# Patient Record
Sex: Female | Born: 1990 | Race: White | Hispanic: No | Marital: Married | Smoking: Current every day smoker
Health system: Southern US, Community
[De-identification: ages and names within clinical notes are randomized; demographics above are authoritative.]

## PROBLEM LIST (undated history)

## (undated) DIAGNOSIS — Z8711 Personal history of peptic ulcer disease: Secondary | ICD-10-CM

## (undated) DIAGNOSIS — Z8719 Personal history of other diseases of the digestive system: Secondary | ICD-10-CM

## (undated) DIAGNOSIS — F32A Depression, unspecified: Secondary | ICD-10-CM

## (undated) DIAGNOSIS — K5900 Constipation, unspecified: Secondary | ICD-10-CM

## (undated) DIAGNOSIS — F1910 Other psychoactive substance abuse, uncomplicated: Secondary | ICD-10-CM

## (undated) DIAGNOSIS — F329 Major depressive disorder, single episode, unspecified: Secondary | ICD-10-CM

## (undated) HISTORY — PX: FRACTURE SURGERY: SHX138

## (undated) HISTORY — PX: OTHER SURGICAL HISTORY: SHX169

---
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
## 2011-04-06 NOTE — H&P (Signed)
NAMEZIANA, HEYLIGER             ACCOUNT NO.:  [id number]   MEDICAL RECORD NO.:  [id number]          PATIENT TYPE:  AMB   LOCATION:  DAY                           FACILITY:  [facility]   PHYSICIAN:  Vickki Hearing, M.D.DATE OF BIRTH:  1991/03/22   DATE OF ADMISSION:  DATE OF DISCHARGE:  LH                                HISTORY & PHYSICAL   CHIEF COMPLAINT:  Hardware in her left forearm.   HISTORY:  In 1991, Drs. Pomeritus and McGee treated her with open  treatment/internal fixation for both-bone forearm fracture.  She still has  some pain in the arm and a mild loss of motion.  Based on her age, I  recommended the plates come out.  She understands that she still may have  pain in the arm.   REVIEW OF SYSTEMS:  Normal, by patient's own history.   No allergies.   No medical problems.   No other surgeries other than listed.   MEDICATIONS:  Megestrol.   FAMILY HISTORY:  Negative.   She is in the 9th grade.  Does not smoke or drink.   PHYSICAL EXAMINATION:  VITAL SIGNS:  Weight 155.  Pulse 80, respiratory rate  16.  HEENT:  Normal.  NECK:  Supple.  LUNGS:  Clear.  HEART:  Rate and rhythm normal.  ABDOMEN:  Soft.  EXTREMITIES:  Left upper extremity:  Two incisions are well healed.  There  is no pain over the incision.  She does have loss of full supination.  She  has good pronation and flexion and extension of the wrist and elbow.  Good  grip strength.  NEUROLOGIC:  No neurologic deficits are noted.   IMPRESSION:  Both-bone forearm fracture, left upper extremity.   PLAN:  Removal of hardware, left upper extremity.      SEH/MEDQ  D:  02/15/2005  T:  02/15/2005  Job:  [id number]

## 2011-04-06 NOTE — Op Note (Signed)
Dyer, Linda             ACCOUNT NO.:  [id number]   MEDICAL RECORD NO.:  [id number]          PATIENT TYPE:  AMB   LOCATION:  DAY                           FACILITY:  [facility]   PHYSICIAN:  Vickki Hearing, M.D.DATE OF BIRTH:  1991-11-14   DATE OF PROCEDURE:  02/16/2005  DATE OF DISCHARGE:                                 OPERATIVE REPORT   HISTORY:  This is a 20 year old female who had a both-bones forearm fracture  which was treated by open treatment internal fixation after failure of cast  treatment.  This was done at [facility] by the __________ and [facility] group.  She  subsequently healed the fracture and presented to my office complaining of  pain with the plates still in place, and she elected to have them removed.   PREOPERATIVE DIAGNOSIS:  Orthopedic implant, forearm fracture, left upper  extremity.   POSTOPERATIVE DIAGNOSIS:  Orthopedic implant, forearm fracture, left upper  extremity.   FINDINGS:  The bone had completely overgrown the hardware, and the bone had  to be removed with burs and osteotomes. The fracture was healed.   PROCEDURE:  Removal of hardware.   SURGEON:  Vickki Hearing, M.D.   ANESTHETIC:  General.   SPECIMEN:  None.   ESTIMATED BLOOD LOSS:  Minimal, less than 10 mL.   COMPLICATIONS:  None.   COUNTS:  Correct.   The patient went to recovery room stable.   DETAILS OF PROCEDURE:  Linda Dyer was identified in the preop holding area  as Linda Dyer.  She marked his surgical site and I countersigned the  left forearm and marked the previous skin incision.  I signed it, and then  she was given Ancef and taken to the operating room for general anesthesia.  The left upper extremity was prepared with a tourniquet and then prepped and  draped using sterile technique.  At this time the time-out was taken and all  elements were completed.  The left forearm was confirmed as the surgical  site on [facility].   We went through the radial  incision first.  Blunt dissection was carried  down to the bone.  The bone was chipped away from the plate and the plate  and screws were removed.  We then did the same procedure on the ulnar side.  We  used copious amounts of irrigation and then closed the wounds with 2-0  Monocryl and 3-0 Prolene.  We injected 30 mL of Marcaine, put on an anterior-  posterior splint, released the tourniquet and extubated the patient, took  her to the recovery room in stable condition.      SEH/MEDQ  D:  02/16/2005  T:  02/16/2005  Job:  [id number]

## 2011-08-16 LAB — URINALYSIS, ROUTINE W REFLEX MICROSCOPIC
Bilirubin Urine: NEGATIVE
Glucose, UA: NEGATIVE
Hgb urine dipstick: NEGATIVE
Ketones, ur: NEGATIVE
Nitrite: NEGATIVE
Protein, ur: NEGATIVE
Specific Gravity, Urine: 1.02
Urobilinogen, UA: 0.2
pH: 5.5

## 2011-08-16 LAB — BASIC METABOLIC PANEL
BUN: 10
CO2: 25
Calcium: 9.4
Chloride: 104
Creatinine, Ser: 0.69
GFR calc Af Amer: >60
GFR calc non Af Amer: >60
Glucose, Bld: 95
Potassium: 4
Sodium: 135

## 2011-08-16 LAB — DIFFERENTIAL
Basophils Absolute: 0
Basophils Relative: 0
Eosinophils Absolute: 0.2
Eosinophils Relative: 2
Lymphocytes Relative: 27
Lymphs Abs: 3
Monocytes Absolute: 0.8
Monocytes Relative: 7
Neutro Abs: 7.1
Neutrophils Relative %: 64

## 2011-08-16 LAB — CBC
HCT: 39.9
Hemoglobin: 14.4
MCHC: 36.1 — ABNORMAL HIGH
MCV: 87.3
Platelets: 249
RBC: 4.57
RDW: 12.3
WBC: 11.1 — ABNORMAL HIGH

## 2011-08-16 LAB — PREGNANCY, URINE: Preg Test, Ur: NEGATIVE

## 2011-08-30 LAB — STREP A DNA PROBE: Group A Strep Probe: NEGATIVE

## 2011-08-30 LAB — RAPID STREP SCREEN (MED CTR MEBANE ONLY): Streptococcus, Group A Screen (Direct): NEGATIVE

## 2011-09-04 LAB — URINALYSIS, ROUTINE W REFLEX MICROSCOPIC
Bilirubin Urine: NEGATIVE
Glucose, UA: NEGATIVE
Hgb urine dipstick: NEGATIVE
Ketones, ur: NEGATIVE
Nitrite: NEGATIVE
Protein, ur: NEGATIVE
Specific Gravity, Urine: 1.02
Urobilinogen, UA: 0.2
pH: 7

## 2011-09-04 LAB — PREGNANCY, URINE: Preg Test, Ur: NEGATIVE

## 2011-09-26 ENCOUNTER — Emergency Department (HOSPITAL_COMMUNITY)
Admission: EM | Admit: 2011-09-26 | Discharge: 2011-09-26 | Disposition: A | Discharge status: Home / Self Care | Payer: Self-pay | Attending: Emergency Medicine | Admitting: Emergency Medicine

## 2011-09-26 ENCOUNTER — Emergency Department (HOSPITAL_COMMUNITY): Payer: Self-pay

## 2011-09-26 ENCOUNTER — Encounter: Payer: Self-pay | Admitting: Emergency Medicine

## 2011-09-26 DIAGNOSIS — R079 Chest pain, unspecified: Secondary | ICD-10-CM | POA: Insufficient documentation

## 2011-09-26 DIAGNOSIS — R059 Cough, unspecified: Secondary | ICD-10-CM | POA: Insufficient documentation

## 2011-09-26 DIAGNOSIS — M25579 Pain in unspecified ankle and joints of unspecified foot: Secondary | ICD-10-CM | POA: Insufficient documentation

## 2011-09-26 DIAGNOSIS — J40 Bronchitis, not specified as acute or chronic: Secondary | ICD-10-CM | POA: Insufficient documentation

## 2011-09-26 DIAGNOSIS — R05 Cough: Secondary | ICD-10-CM | POA: Insufficient documentation

## 2011-09-26 DIAGNOSIS — M722 Plantar fascial fibromatosis: Secondary | ICD-10-CM | POA: Insufficient documentation

## 2011-09-26 MED ORDER — GUAIFENESIN-CODEINE 100-10 MG/5ML PO SYRP: 10.0000 mL | ORAL_SOLUTION | Freq: Three times a day (TID) | ORAL | Status: AC | PRN

## 2011-09-26 MED ORDER — ALBUTEROL SULFATE HFA 108 (90 BASE) MCG/ACT IN AERS
2.0000 | INHALATION_SPRAY | Freq: Once | RESPIRATORY_TRACT | Status: AC
  Administered 2011-09-26: 2 via RESPIRATORY_TRACT
  Filled 2011-09-26: qty 6.7

## 2011-09-26 MED ORDER — DOXYCYCLINE HYCLATE 100 MG PO CAPS: 100.0000 mg | ORAL_CAPSULE | Freq: Two times a day (BID) | ORAL | Status: AC

## 2011-09-26 MED ORDER — IBUPROFEN 800 MG PO TABS: 800.0000 mg | ORAL_TABLET | Freq: Three times a day (TID) | ORAL | Status: AC

## 2011-09-26 NOTE — ED Provider Notes (Signed)
History     CSN: [id number] Arrival date & time: 09/26/2011  4:45 PM   First MD Initiated Contact with Patient 09/26/11 1709      Chief Complaint  Patient presents with  . Cough    (Consider location/radiation/quality/duration/timing/severity/associated sxs/prior treatment) Patient is a 20 y.o. female presenting with cough. The history is provided by the patient.  Cough This is a new problem. The current episode started more than 1 week ago. The problem occurs every few minutes. The problem has not changed since onset.The cough is productive of sputum. There has been no fever. Associated symptoms include wheezing. Pertinent negatives include no chest pain, no chills, no ear congestion, no ear pain, no headaches, no rhinorrhea, no sore throat, no myalgias, no shortness of breath and no eye redness. Associated symptoms comments: Chest tightness. She has tried nothing for the symptoms. The treatment provided no relief. She is a smoker. Her past medical history does not include bronchitis, pneumonia, COPD or asthma.    History reviewed. No pertinent past medical history.  Past Surgical History  Procedure Date  . Left arm     History reviewed. No pertinent family history.  History  Substance Use Topics  . Smoking status: Never Smoker   . Smokeless tobacco: Not on file  . Alcohol Use: No    OB History    Grav Para Term Preterm Abortions TAB SAB Ect Mult Living                  Review of Systems  Constitutional: Negative for fever, chills and fatigue.  HENT: Positive for congestion. Negative for ear pain, sore throat, rhinorrhea, trouble swallowing, neck pain and neck stiffness.   Eyes: Negative for redness.  Respiratory: Positive for cough, chest tightness and wheezing. Negative for shortness of breath.   Cardiovascular: Negative for chest pain.  Gastrointestinal: Negative for nausea, vomiting and abdominal pain.  Genitourinary: Negative for dysuria and flank pain.    Musculoskeletal: Negative for myalgias, back pain and arthralgias.  Skin: Negative for rash.  Neurological: Negative for dizziness, weakness, numbness and headaches.  Hematological: Negative for adenopathy. Does not bruise/bleed easily.  All other systems reviewed and are negative.    Allergies  Review of patient's allergies indicates no known allergies.  Home Medications   Current Outpatient Rx  Name Route Sig Dispense Refill  . CITALOPRAM HYDROBROMIDE 20 MG PO TABS Oral Take 20 mg by mouth at bedtime.      Marland Kitchen DEXTROMETHORPHAN HBR 15 MG/5ML PO LIQD Oral Take 10 mLs by mouth as needed. For cough     . ETONOGESTREL 68 MG [place] IMPL Subcutaneous Inject 1 each into the skin once.        BP 125/67  Pulse 77  Temp(Src) 98 F (36.7 C) (Oral)  Resp 20  Ht 5\' 10"  (1.778 m)  Wt 200 lb (90.719 kg)  BMI 28.70 kg/m2  SpO2 100%  LMP 09/03/2011  Physical Exam  Nursing note and vitals reviewed. Constitutional: She is oriented to person, place, and time. She appears well-developed and well-nourished. No distress.  HENT:  Head: Normocephalic and atraumatic. No trismus in the jaw.  Right Ear: Tympanic membrane normal. No mastoid tenderness. No hemotympanum.  Left Ear: Tympanic membrane normal. No mastoid tenderness. No hemotympanum.  Mouth/Throat: Uvula is midline and mucous membranes are normal. No uvula swelling. Posterior oropharyngeal erythema present. No oropharyngeal exudate, posterior oropharyngeal edema or tonsillar abscesses.  Neck: Normal range of motion. Neck supple. No JVD present.  Cardiovascular: Normal rate, regular rhythm and normal heart sounds.   Pulmonary/Chest: Effort normal. No respiratory distress. She has wheezes. She has no rales. She exhibits no tenderness.  Abdominal: Soft. She exhibits no distension and no mass. There is no tenderness. There is no rebound and no guarding.  Musculoskeletal: Normal range of motion. She exhibits no edema and no tenderness.   Lymphadenopathy:    She has no cervical adenopathy.  Neurological: She is alert and oriented to person, place, and time. No cranial nerve deficit. She exhibits normal muscle tone. Coordination normal.  Skin: Skin is warm and dry.    ED Course  Procedures (including critical care time)  Dg Chest 2 View  09/26/2011  *RADIOLOGY REPORT*  Clinical Data: Cough, pain  CHEST - 2 VIEW  Comparison: 08/28/2004  Findings: Cardiomediastinal silhouette is stable.  Pectus excavatum deformity again noted. No acute infiltrate or pleural effusion.  No pulmonary edema.  IMPRESSION: No active disease.  No significant change.  Original Report Authenticated By: Natasha Mead, M.D.   Dg Foot Complete Right  09/26/2011  *RADIOLOGY REPORT*  Clinical Data: Right foot pain  RIGHT FOOT COMPLETE - 3+ VIEW  Comparison: None.  Findings: Three views of the right foot submitted.  No acute fracture or subluxation.  No periosteal reaction or bony erosion.  IMPRESSION: No acute fracture or subluxation.  Original Report Authenticated By: Natasha Mead, M.D.        MDM    7:10 PM patient is alert, NAD.  Non-toxic appearing. Cough, nasal congestion and occasional expiratory wheezing.   No fever, hypoxia, tachycardia or tachypnea to suggest PE.   Will treat with albuterol inhaler, abx and cough syrup.  Agrees to close follow-up with her PMD.  Also agrees to return here if sx's worsen.        Shaylynne Lunt L. [place], [place] 09/27/11 2026

## 2011-09-26 NOTE — ED Notes (Addendum)
Pt c/o cough/congestion x 10 days. Pt c/o chest pain from coughing x 2 days.  Pt also c/o bilateral foot pain.

## 2011-09-28 NOTE — ED Provider Notes (Signed)
Medical screening examination/treatment/procedure(s) were performed by non-physician practitioner and as supervising physician I was immediately available for consultation/collaboration.   Joya Gaskins, MD 09/28/11 [phone]

## 2012-09-01 ENCOUNTER — Emergency Department (HOSPITAL_COMMUNITY)
Admission: EM | Admit: 2012-09-01 | Discharge: 2012-09-01 | Disposition: A | Discharge status: Home / Self Care | Payer: Self-pay | Attending: Emergency Medicine | Admitting: Emergency Medicine

## 2012-09-01 ENCOUNTER — Emergency Department (HOSPITAL_COMMUNITY): Payer: Self-pay

## 2012-09-01 ENCOUNTER — Encounter (HOSPITAL_COMMUNITY): Payer: Self-pay

## 2012-09-01 DIAGNOSIS — R109 Unspecified abdominal pain: Secondary | ICD-10-CM

## 2012-09-01 DIAGNOSIS — K5900 Constipation, unspecified: Secondary | ICD-10-CM | POA: Insufficient documentation

## 2012-09-01 HISTORY — DX: Constipation, unspecified: K59.00

## 2012-09-01 LAB — CBC WITH DIFFERENTIAL/PLATELET
Basophils Absolute: 0 10*3/uL (ref 0.0–0.1)
Basophils Relative: 1 % (ref 0–1)
Eosinophils Absolute: 0.2 10*3/uL (ref 0.0–0.7)
Eosinophils Relative: 3 % (ref 0–5)
HCT: 42.2 % (ref 36.0–46.0)
Hemoglobin: 14.4 g/dL (ref 12.0–15.0)
Lymphocytes Relative: 33 % (ref 12–46)
Lymphs Abs: 2.1 10*3/uL (ref 0.7–4.0)
MCH: 30.1 pg (ref 26.0–34.0)
MCHC: 34.1 g/dL (ref 30.0–36.0)
MCV: 88.1 fL (ref 78.0–100.0)
Monocytes Absolute: 0.6 10*3/uL (ref 0.1–1.0)
Monocytes Relative: 10 % (ref 3–12)
Neutro Abs: 3.5 10*3/uL (ref 1.7–7.7)
Neutrophils Relative %: 53 % (ref 43–77)
Platelets: 182 10*3/uL (ref 150–400)
RBC: 4.79 MIL/uL (ref 3.87–5.11)
RDW: 12.3 % (ref 11.5–15.5)
WBC: 6.4 10*3/uL (ref 4.0–10.5)

## 2012-09-01 LAB — COMPREHENSIVE METABOLIC PANEL
ALT: 9 U/L (ref 0–35)
AST: 13 U/L (ref 0–37)
Albumin: 3.9 g/dL (ref 3.5–5.2)
Alkaline Phosphatase: 58 U/L (ref 39–117)
BUN: 7 mg/dL (ref 6–23)
CO2: 28 mEq/L (ref 19–32)
Calcium: 9.3 mg/dL (ref 8.4–10.5)
Chloride: 102 mEq/L (ref 96–112)
Creatinine, Ser: 0.63 mg/dL (ref 0.50–1.10)
GFR calc Af Amer: >90 mL/min (ref >90)
GFR calc non Af Amer: >90 mL/min (ref >90)
Glucose, Bld: 87 mg/dL (ref 70–99)
Potassium: 3.7 mEq/L (ref 3.5–5.1)
Sodium: 138 mEq/L (ref 135–145)
Total Bilirubin: 0.2 mg/dL — ABNORMAL LOW (ref 0.3–1.2)
Total Protein: 7.1 g/dL (ref 6.0–8.3)

## 2012-09-01 LAB — PREGNANCY, URINE: Preg Test, Ur: NEGATIVE

## 2012-09-01 LAB — URINALYSIS, ROUTINE W REFLEX MICROSCOPIC
Bilirubin Urine: NEGATIVE
Glucose, UA: NEGATIVE mg/dL
Hgb urine dipstick: NEGATIVE
Ketones, ur: NEGATIVE mg/dL
Leukocytes, UA: NEGATIVE
Nitrite: NEGATIVE
Protein, ur: NEGATIVE mg/dL
Specific Gravity, Urine: 1.015 (ref 1.005–1.030)
Urobilinogen, UA: 1 mg/dL (ref 0.0–1.0)
pH: 8.5 — ABNORMAL HIGH (ref 5.0–8.0)

## 2012-09-01 LAB — LIPASE, BLOOD: Lipase: 20 U/L (ref 11–59)

## 2012-09-01 MED ORDER — IOHEXOL 300 MG/ML  SOLN
100.0000 mL | Freq: Once | INTRAMUSCULAR | Status: AC | PRN
  Administered 2012-09-01: 100 mL via INTRAVENOUS

## 2012-09-01 MED ORDER — SODIUM CHLORIDE 0.9 % IV SOLN
INTRAVENOUS | Status: DC
  Administered 2012-09-01: 21:00:00 via INTRAVENOUS

## 2012-09-01 NOTE — ED Notes (Signed)
Haven't been able to go to the bathroom really well in about a month. Have taken 5 enemas, ex-lax, mag citrate (today). Having abdominal pain and lower back pain per pt.

## 2012-09-01 NOTE — Discharge Instructions (Signed)
Follow up as needed

## 2012-09-01 NOTE — ED Provider Notes (Signed)
History     CSN: [id number]  Arrival date & time 09/01/12  6578   First MD Initiated Contact with Patient 09/01/12 2028      Chief Complaint  Patient presents with  . Abdominal Pain  . Constipation     HPI Pt was seen at 2030.  Per pt, c/o gradual onset and persistence of constant generalized abd "pain" for the past 1 month.  Has been associated with constipation and nausea.  States she has taken multiple enemas, magnesium citrate and ex-lax without relief.  Describes the abd pain as "cramping."  Denies vomiting, no flank pain, no fevers, no vaginal bleeding/discharge.    Past Medical History  Diagnosis Date  . Constipation     around age 21    Past Surgical History  Procedure Date  . Left arm     History  Substance Use Topics  . Smoking status: Never Smoker   . Smokeless tobacco: Not on file  . Alcohol Use: No      Review of Systems ROS: Statement: All systems negative except as marked or noted in the HPI; Constitutional: Negative for fever and chills. ; ; Eyes: Negative for eye pain, redness and discharge. ; ; ENMT: Negative for ear pain, hoarseness, nasal congestion, sinus pressure and sore throat. ; ; Cardiovascular: Negative for chest pain, palpitations, diaphoresis, dyspnea and peripheral edema. ; ; Respiratory: Negative for cough, wheezing and stridor. ; ; Gastrointestinal: +abd pain, constipation, nausea. Negative for vomiting, diarrhea, blood in stool, hematemesis, jaundice and rectal bleeding. . ; ; Genitourinary: Negative for dysuria, flank pain and hematuria. ; ; Musculoskeletal: Negative for back pain and neck pain. Negative for swelling and trauma.; ; Skin: Negative for pruritus, rash, abrasions, blisters, bruising and skin lesion.; ; Neuro: Negative for headache, lightheadedness and neck stiffness. Negative for weakness, altered level of consciousness , altered mental status, extremity weakness, paresthesias, involuntary movement, seizure and syncope.        Allergies  Review of patient's allergies indicates no known allergies.  Home Medications   Current Outpatient Rx  Name Route Sig Dispense Refill  . CITALOPRAM HYDROBROMIDE 20 MG PO TABS Oral Take 20 mg by mouth at bedtime.      Marland Kitchen DEXTROMETHORPHAN HBR 15 MG/5ML PO LIQD Oral Take 10 mLs by mouth as needed. For cough     . ETONOGESTREL 68 MG [place] IMPL Subcutaneous Inject 1 each into the skin once.        BP 126/72  Pulse 95  Temp 98.5 F (36.9 C) (Oral)  Resp 20  Ht 5\' 11"  (1.803 m)  Wt 205 lb (92.987 kg)  BMI 28.59 kg/m2  SpO2 96%  LMP 08/18/2012  Physical Exam 2035: Physical examination:  Nursing notes reviewed; Vital signs and O2 SAT reviewed;  Constitutional: Well developed, Well nourished, Well hydrated, In no acute distress; Head:  Normocephalic, atraumatic; Eyes: EOMI, PERRL, No scleral icterus; ENMT: Mouth and pharynx normal, Mucous membranes moist; Neck: Supple, Full range of motion, No lymphadenopathy; Cardiovascular: Regular rate and rhythm, No murmur, rub, or gallop; Respiratory: Breath sounds clear & equal bilaterally, No rales, rhonchi, wheezes.  Speaking full sentences with ease, Normal respiratory effort/excursion; Chest: Nontender, Movement normal; Abdomen: Soft, +mild diffuse tenderness to palp. No rebound or guarding. Nondistended, Normal bowel sounds; Genitourinary: No CVA tenderness; Extremities: Pulses normal, No tenderness, No edema, No calf edema or asymmetry.; Neuro: AA&Ox3, Major CN grossly intact.  Speech clear. No gross focal motor or sensory deficits in extremities.; Skin: Color normal,  Warm, Dry.   ED Course  Procedures   MDM  MDM Reviewed: nursing note and vitals Interpretation: labs     Results for orders placed during the hospital encounter of 09/01/12  CBC WITH DIFFERENTIAL      Component Value Range   WBC 6.4  4.0 - 10.5 K/uL   RBC 4.79  3.87 - 5.11 MIL/uL   Hemoglobin 14.4  12.0 - 15.0 g/dL   HCT 36.6  44.0 - 34.7 %   MCV 88.1   78.0 - 100.0 fL   MCH 30.1  26.0 - 34.0 pg   MCHC 34.1  30.0 - 36.0 g/dL   RDW 42.5  95.6 - 38.7 %   Platelets 182  150 - 400 K/uL   Neutrophils Relative 53  43 - 77 %   Neutro Abs 3.5  1.7 - 7.7 K/uL   Lymphocytes Relative 33  12 - 46 %   Lymphs Abs 2.1  0.7 - 4.0 K/uL   Monocytes Relative 10  3 - 12 %   Monocytes Absolute 0.6  0.1 - 1.0 K/uL   Eosinophils Relative 3  0 - 5 %   Eosinophils Absolute 0.2  0.0 - 0.7 K/uL   Basophils Relative 1  0 - 1 %   Basophils Absolute 0.0  0.0 - 0.1 K/uL  COMPREHENSIVE METABOLIC PANEL      Component Value Range   Sodium 138  135 - 145 mEq/L   Potassium 3.7  3.5 - 5.1 mEq/L   Chloride 102  96 - 112 mEq/L   CO2 28  19 - 32 mEq/L   Glucose, Bld 87  70 - 99 mg/dL   BUN 7  6 - 23 mg/dL   Creatinine, Ser 5.64  0.50 - 1.10 mg/dL   Calcium 9.3  8.4 - 33.2 mg/dL   Total Protein 7.1  6.0 - 8.3 g/dL   Albumin 3.9  3.5 - 5.2 g/dL   AST 13  0 - 37 U/L   ALT 9  0 - 35 U/L   Alkaline Phosphatase 58  39 - 117 U/L   Total Bilirubin 0.2 (*) 0.3 - 1.2 mg/dL   GFR calc non Af Amer >90  >90 mL/min   GFR calc Af Amer >90  >90 mL/min  LIPASE, BLOOD      Component Value Range   Lipase 20  11 - 59 U/L  PREGNANCY, URINE      Component Value Range   Preg Test, Ur NEGATIVE  NEGATIVE  URINALYSIS, ROUTINE W REFLEX MICROSCOPIC      Component Value Range   Color, Urine YELLOW  YELLOW   APPearance HAZY (*) CLEAR   Specific Gravity, Urine 1.015  1.005 - 1.030   pH 8.5 (*) 5.0 - 8.0   Glucose, UA NEGATIVE  NEGATIVE mg/dL   Hgb urine dipstick NEGATIVE  NEGATIVE   Bilirubin Urine NEGATIVE  NEGATIVE   Ketones, ur NEGATIVE  NEGATIVE mg/dL   Protein, ur NEGATIVE  NEGATIVE mg/dL   Urobilinogen, UA 1.0  0.0 - 1.0 mg/dL   Nitrite NEGATIVE  NEGATIVE   Leukocytes, UA NEGATIVE  NEGATIVE     2240:  Has tol PO well in ED without N/V.  CT scan pending.  Sign out to Dr. Estell Harpin.          Laray Anger, DO 09/01/12 2241

## 2012-11-02 ENCOUNTER — Encounter (HOSPITAL_COMMUNITY): Payer: Self-pay

## 2012-11-02 ENCOUNTER — Emergency Department (HOSPITAL_COMMUNITY)
Admission: EM | Admit: 2012-11-02 | Discharge: 2012-11-02 | Disposition: A | Discharge status: Home / Self Care | Payer: Self-pay | Attending: Emergency Medicine | Admitting: Emergency Medicine

## 2012-11-02 ENCOUNTER — Emergency Department (HOSPITAL_COMMUNITY): Payer: Self-pay

## 2012-11-02 DIAGNOSIS — R109 Unspecified abdominal pain: Secondary | ICD-10-CM | POA: Insufficient documentation

## 2012-11-02 DIAGNOSIS — K5900 Constipation, unspecified: Secondary | ICD-10-CM | POA: Insufficient documentation

## 2012-11-02 DIAGNOSIS — R509 Fever, unspecified: Secondary | ICD-10-CM | POA: Insufficient documentation

## 2012-11-02 MED ORDER — PEG 3350-KCL-NA BICARB-NACL 420 G PO SOLR
4000.0000 mL | Freq: Once | ORAL | Status: AC
  Administered 2012-11-02: 4000 mL via ORAL
  Filled 2012-11-02: qty 4000

## 2012-11-02 MED ORDER — FLEET ENEMA 7-19 GM/118ML RE ENEM
1.0000 | ENEMA | Freq: Once | RECTAL | Status: AC
  Administered 2012-11-02: 1 via RECTAL

## 2012-11-02 NOTE — ED Notes (Signed)
I have been having problems with constipation. I have been using an enema without relief per pt. I have been running a fever and shaking per pt.

## 2012-11-02 NOTE — ED Provider Notes (Signed)
History     CSN: [id number]  Arrival date & time 11/02/12  0112   First MD Initiated Contact with Patient 11/02/12 0139      Chief Complaint  Patient presents with  . Constipation    (Consider location/radiation/quality/duration/timing/severity/associated sxs/prior treatment) HPI   Linda Dyer is a 21 y.o. female who presents to the Emergency Department complaining of constipation. She states she has been using an enema occasionally since being seen for similar symptoms 09/01/12. She has not had a proper bowel movement since being in the ER previously. She states that she has had fever and abdominal cramping on occasion since 09/01/12. CT 09/01/12  showing only stool burden. She has not seen GI due to financial problems. Has been using a stool softener occasionally.   PCP Dr. Dimas Aguas Past Medical History  Diagnosis Date  . Constipation     around age 51    Past Surgical History  Procedure Date  . Left arm     No family history on file.  History  Substance Use Topics  . Smoking status: Never Smoker   . Smokeless tobacco: Not on file  . Alcohol Use: No    OB History    Grav Para Term Preterm Abortions TAB SAB Ect Mult Living                  Review of Systems  Constitutional: Negative for fever.       10 Systems reviewed and are negative for acute change except as noted in the HPI.  HENT: Negative for congestion.   Eyes: Negative for discharge and redness.  Respiratory: Negative for cough and shortness of breath.   Cardiovascular: Negative for chest pain.  Gastrointestinal: Negative for vomiting and abdominal pain.       Constipation  Musculoskeletal: Negative for back pain.  Skin: Negative for rash.  Neurological: Negative for syncope, numbness and headaches.  Psychiatric/Behavioral:       No behavior change.    Allergies  Review of patient's allergies indicates no known allergies.  Home Medications   Current Outpatient Rx  Name  Route  Sig   Dispense  Refill  . BISACODYL 5 MG PO TBEC   Oral   Take 5 mg by mouth once as needed.         Marland Kitchen CITALOPRAM HYDROBROMIDE 20 MG PO TABS   Oral   Take 20 mg by mouth at bedtime.           . ETONOGESTREL 68 MG [place] IMPL   Subcutaneous   Inject 1 each into the skin once.           Marland Kitchen ENEMA RE   Rectal   Place 1 Bottle rectally once as needed.           BP 120/70  Pulse 86  Temp 98.1 F (36.7 C) (Oral)  Resp 18  Ht 5\' 11"  (1.803 m)  Wt 210 lb (95.255 kg)  BMI 29.29 kg/m2  SpO2 96%  LMP 10/19/2012  Physical Exam  Nursing note and vitals reviewed. Constitutional: She appears well-developed and well-nourished.       Awake, alert, nontoxic appearance.  HENT:  Head: Atraumatic.  Eyes: Right eye exhibits no discharge. Left eye exhibits no discharge.  Neck: Neck supple.  Cardiovascular: Normal heart sounds.   Pulmonary/Chest: Effort normal and breath sounds normal. She exhibits no tenderness.  Abdominal: Soft. There is no tenderness. There is no rebound.  Genitourinary:  No stool int he rectal vault  Musculoskeletal: Normal range of motion. She exhibits no tenderness.       Baseline ROM, no obvious new focal weakness.  Neurological:       Mental status and motor strength appears baseline for patient and situation.  Skin: No rash noted.  Psychiatric: She has a normal mood and affect.    ED Course  Procedures (including critical care time)  Labs Reviewed - No data to display Dg Abd 1 View  11/02/2012  *RADIOLOGY REPORT*  Clinical Data: Constipation for months.  ABDOMEN - 1 VIEW  Comparison: Abdomen and pelvis 09/01/2012  Findings: Diffusely stool filled colon.  No small bowel distension. No radiopaque stones.  Visualized bones appear intact.  IMPRESSION: Diffusely stool filled colon.  Nonobstructive bowel gas pattern.   Original Report Authenticated By: Burman Nieves, M.D.      1. Constipation       MDM  Patient with h/o constipation here with same.  KUB with large stool burden. Given fleet enema with no results. Sent home with Nulytely and instructions to administer 3 cups every 8 hours. Referral made to GI. Pt stable in ED with no significant deterioration in condition.The patient appears reasonably screened and/or stabilized for discharge and I doubt any other medical condition or other [facility] requiring further screening, evaluation, or treatment in the ED at this time prior to discharge.  MDM Reviewed: nursing note, previous chart and vitals Reviewed previous: CT scan Interpretation: x-ray           Nicoletta Dress. Colon Branch, MD 11/02/12 [phone]

## 2013-04-02 ENCOUNTER — Emergency Department (HOSPITAL_COMMUNITY)
Admission: EM | Admit: 2013-04-02 | Discharge: 2013-04-03 | Disposition: A | Discharge status: Home / Self Care | Payer: Self-pay | Attending: Emergency Medicine | Admitting: Emergency Medicine

## 2013-04-02 DIAGNOSIS — R1110 Vomiting, unspecified: Secondary | ICD-10-CM | POA: Insufficient documentation

## 2013-04-02 DIAGNOSIS — Z79899 Other long term (current) drug therapy: Secondary | ICD-10-CM | POA: Insufficient documentation

## 2013-04-02 DIAGNOSIS — Z3202 Encounter for pregnancy test, result negative: Secondary | ICD-10-CM | POA: Insufficient documentation

## 2013-04-02 DIAGNOSIS — K5900 Constipation, unspecified: Secondary | ICD-10-CM | POA: Insufficient documentation

## 2013-04-02 DIAGNOSIS — R1084 Generalized abdominal pain: Secondary | ICD-10-CM | POA: Insufficient documentation

## 2013-04-03 ENCOUNTER — Encounter (HOSPITAL_COMMUNITY): Payer: Self-pay | Admitting: *Deleted

## 2013-04-03 ENCOUNTER — Emergency Department (HOSPITAL_COMMUNITY): Payer: Self-pay

## 2013-04-03 LAB — URINALYSIS, ROUTINE W REFLEX MICROSCOPIC
Bilirubin Urine: NEGATIVE
Glucose, UA: NEGATIVE mg/dL
Ketones, ur: NEGATIVE mg/dL
Leukocytes, UA: NEGATIVE
Protein, ur: NEGATIVE mg/dL

## 2013-04-03 LAB — COMPREHENSIVE METABOLIC PANEL
Alkaline Phosphatase: 61 U/L (ref 39–117)
BUN: 13 mg/dL (ref 6–23)
CO2: 26 mEq/L (ref 19–32)
Chloride: 97 mEq/L (ref 96–112)
Creatinine, Ser: 0.79 mg/dL (ref 0.50–1.10)
GFR calc non Af Amer: >90 mL/min (ref >90)
Potassium: 3.7 mEq/L (ref 3.5–5.1)
Total Bilirubin: 0.2 mg/dL — ABNORMAL LOW (ref 0.3–1.2)

## 2013-04-03 LAB — LIPASE, BLOOD: Lipase: 22 U/L (ref 11–59)

## 2013-04-03 LAB — CBC
HCT: 42.7 % (ref 36.0–46.0)
Hemoglobin: 14.9 g/dL (ref 12.0–15.0)
MCV: 90.5 fL (ref 78.0–100.0)
RBC: 4.72 MIL/uL (ref 3.87–5.11)
WBC: 8.6 10*3/uL (ref 4.0–10.5)

## 2013-04-03 MED ORDER — ONDANSETRON HCL 4 MG PO TABS: 4.0000 mg | ORAL_TABLET | Freq: Four times a day (QID) | ORAL | Status: DC

## 2013-04-03 MED ORDER — IOHEXOL 300 MG/ML  SOLN
50.0000 mL | Freq: Once | INTRAMUSCULAR | Status: AC | PRN
  Administered 2013-04-03: 50 mL via ORAL

## 2013-04-03 MED ORDER — DOCUSATE SODIUM 100 MG PO CAPS: 100.0000 mg | ORAL_CAPSULE | Freq: Two times a day (BID) | ORAL | Status: DC

## 2013-04-03 MED ORDER — IOHEXOL 300 MG/ML  SOLN
100.0000 mL | Freq: Once | INTRAMUSCULAR | Status: AC | PRN
  Administered 2013-04-03: 100 mL via INTRAVENOUS

## 2013-04-03 MED ORDER — ONDANSETRON HCL 4 MG/2ML IJ SOLN
4.0000 mg | Freq: Once | INTRAMUSCULAR | Status: AC
  Administered 2013-04-03: 4 mg via INTRAVENOUS
  Filled 2013-04-03: qty 2

## 2013-04-03 MED ORDER — SODIUM CHLORIDE 0.9 % IV SOLN
INTRAVENOUS | Status: DC
  Administered 2013-04-03: 01:00:00 via INTRAVENOUS

## 2013-04-03 NOTE — ED Provider Notes (Signed)
History     CSN: [id number]  Arrival date & time 04/02/13  2336   First MD Initiated Contact with Patient 04/03/13 0126      Chief Complaint  Patient presents with  . Constipation  . Emesis  . Abdominal Pain    (Consider location/radiation/quality/duration/timing/severity/associated sxs/prior treatment) HPI HX provided by patient - has chronic constipation worse over the last 2 weeks, no BM in 10 days despite daily stool softner, mag citrate yesterday and fleets enema.  She developed emesis yesterday with upper ABD discomfort. She has no h/o ABD surgeries, no h/o bowel obstruction but is worried about the same. No F/C, no recent illness otherwise, no new medications. Symptoms mod in severity. Has never seen a GI specialist   Past Medical History  Diagnosis Date  . Constipation     around age 22    Past Surgical History  Procedure Laterality Date  . Left arm      History reviewed. No pertinent family history.  History  Substance Use Topics  . Smoking status: Never Smoker   . Smokeless tobacco: Not on file  . Alcohol Use: No    OB History   Grav Para Term Preterm Abortions TAB SAB Ect Mult Living                  Review of Systems  Constitutional: Negative for fever and chills.  HENT: Negative for neck pain and neck stiffness.   Eyes: Negative for pain.  Respiratory: Negative for shortness of breath.   Cardiovascular: Negative for chest pain.  Gastrointestinal: Positive for vomiting, abdominal pain and constipation.  Genitourinary: Negative for dysuria.  Musculoskeletal: Negative for back pain.  Skin: Negative for rash.  Neurological: Negative for headaches.  All other systems reviewed and are negative.    Allergies  Review of patient's allergies indicates no known allergies.  Home Medications   Current Outpatient Rx  Name  Route  Sig  Dispense  Refill  . bisacodyl (DULCOLAX) 5 MG EC tablet   Oral   Take 5 mg by mouth once as needed.         .  citalopram (CELEXA) 20 MG tablet   Oral   Take 20 mg by mouth at bedtime.           Marland Kitchen levonorgestrel-ethinyl estradiol (AVIANE,ALESSE,LESSINA) 0.1-20 MG-MCG tablet   Oral   Take 1 tablet by mouth daily.         . Sodium Phosphates (ENEMA RE)   Rectal   Place 1 Bottle rectally once as needed.         . etonogestrel (IMPLANON) 68 MG IMPL implant   Subcutaneous   Inject 1 each into the skin once.             BP 142/86  Pulse 84  Temp(Src) 97.9 F (36.6 C) (Oral)  Ht 5\' 11"  (1.803 m)  Wt 210 lb (95.255 kg)  BMI 29.3 kg/m2  SpO2 100%  LMP 04/03/2013  Physical Exam  Constitutional: She is oriented to person, place, and time. She appears well-developed and well-nourished.  HENT:  Head: Normocephalic and atraumatic.  Mouth/Throat: Oropharynx is clear and moist. No oropharyngeal exudate.  Eyes: EOM are normal. Pupils are equal, round, and reactive to light. No scleral icterus.  Neck: Neck supple.  Cardiovascular: Normal rate, regular rhythm, normal heart sounds and intact distal pulses.   Pulmonary/Chest: Effort normal and breath sounds normal. No respiratory distress.  Abdominal: Soft. Bowel sounds are normal. She exhibits  no distension. There is no rebound and no guarding.  Mild diffuse tenderness  Musculoskeletal: Normal range of motion. She exhibits no edema.  Neurological: She is alert and oriented to person, place, and time.  Skin: Skin is warm and dry.    ED Course  Procedures (including critical care time)  Results for orders placed during the hospital encounter of 04/02/13  CBC      Result Value Range   WBC 8.6  4.0 - 10.5 K/uL   RBC 4.72  3.87 - 5.11 MIL/uL   Hemoglobin 14.9  12.0 - 15.0 g/dL   HCT 40.9  81.1 - 91.4 %   MCV 90.5  78.0 - 100.0 fL   MCH 31.6  26.0 - 34.0 pg   MCHC 34.9  30.0 - 36.0 g/dL   RDW 78.2  95.6 - 21.3 %   Platelets 174  150 - 400 K/uL  COMPREHENSIVE METABOLIC PANEL      Result Value Range   Sodium 136  135 - 145 mEq/L    Potassium 3.7  3.5 - 5.1 mEq/L   Chloride 97  96 - 112 mEq/L   CO2 26  19 - 32 mEq/L   Glucose, Bld 88  70 - 99 mg/dL   BUN 13  6 - 23 mg/dL   Creatinine, Ser 0.86  0.50 - 1.10 mg/dL   Calcium 9.5  8.4 - 57.8 mg/dL   Total Protein 8.1  6.0 - 8.3 g/dL   Albumin 4.3  3.5 - 5.2 g/dL   AST 16  0 - 37 U/L   ALT 17  0 - 35 U/L   Alkaline Phosphatase 61  39 - 117 U/L   Total Bilirubin 0.2 (*) 0.3 - 1.2 mg/dL   GFR calc non Af Amer >90  >90 mL/min   GFR calc Af Amer >90  >90 mL/min  LIPASE, BLOOD      Result Value Range   Lipase 22  11 - 59 U/L  URINALYSIS, ROUTINE W REFLEX MICROSCOPIC      Result Value Range   Color, Urine YELLOW  YELLOW   APPearance CLEAR  CLEAR   Specific Gravity, Urine <1.005 (*) 1.005 - 1.030   pH 5.5  5.0 - 8.0   Glucose, UA NEGATIVE  NEGATIVE mg/dL   Hgb urine dipstick NEGATIVE  NEGATIVE   Bilirubin Urine NEGATIVE  NEGATIVE   Ketones, ur NEGATIVE  NEGATIVE mg/dL   Protein, ur NEGATIVE  NEGATIVE mg/dL   Urobilinogen, UA 0.2  0.0 - 1.0 mg/dL   Nitrite NEGATIVE  NEGATIVE   Leukocytes, UA NEGATIVE  NEGATIVE  POCT PREGNANCY, URINE      Result Value Range   Preg Test, Ur NEGATIVE  NEGATIVE   Ct Abdomen Pelvis W Contrast  04/03/2013   *RADIOLOGY REPORT*  Clinical Data: Abdominal pain  CT ABDOMEN AND PELVIS WITH CONTRAST  Technique:  Multidetector CT imaging of the abdomen and pelvis was performed following the standard protocol during bolus administration of intravenous contrast.  Contrast: 50mL OMNIPAQUE IOHEXOL 300 MG/ML  SOLN, OMNIPAQUE IOHEXOL 300 MG/ML  SOLN  Comparison: 09/01/2012  Findings: Lung bases are predominately clear.  Heart size within normal limits.  No pleural or pericardial effusion.  Unremarkable liver, biliary system, spleen, pancreas, adrenal glands.  Symmetric renal enhancement.  Extrarenal pelvis on the right.  No hydronephrosis or hydroureter.  No CT evidence for colitis.  Normal appendix.  Small bowel loops are normal course and caliber.   No free intraperitoneal  air.  Trace free fluid within the pelvis.  Partially distended bladder.  Tampon within the vagina. Heterogeneous attenuation of the uterus with a rounded area of enhancement posteriorly favored to reflect a fibroid.  No adnexal mass.  Prominent left gonadal vein and pelvic vessels.  Normal caliber aorta and branch vessels.  No acute osseous finding.  IMPRESSION: No acute abdominopelvic process identified by CT.  Posterior uterine fibroid.   Original Report Authenticated By: Jearld Lesch, M.D.   IVFs, zofran  4:11 AM repeat exam no acute ABD - constipation precautions provided and verbalized as understood, diet instructions and referral provided. RX colace and zofran. Plan continue miralax.   MDM   Constipation with emesis, evaluated with CT scan no obstruction.  UA and labs reviewed. VS and nursing notes reviewed.         Sunnie Nielsen, MD 04/03/13 [phone]

## 2013-04-03 NOTE — ED Notes (Signed)
Pt alert & oriented x4, stable gait. Patient given discharge instructions, paperwork & prescription(s). Patient  instructed to stop at the registration desk to finish any additional paperwork. Patient verbalized understanding. Pt left department w/ no further questions. 

## 2013-04-03 NOTE — ED Notes (Signed)
Pt has not had a good BM in 10 days, has used enemas, xlax, and magnesium citrate with very little results. Also co n/v

## 2013-12-17 ENCOUNTER — Encounter (HOSPITAL_COMMUNITY): Payer: Self-pay | Admitting: Emergency Medicine

## 2013-12-17 ENCOUNTER — Emergency Department (HOSPITAL_COMMUNITY)
Admission: EM | Admit: 2013-12-17 | Discharge: 2013-12-18 | Disposition: A | Discharge status: Home / Self Care | Payer: Medicaid Other | Attending: Emergency Medicine | Admitting: Emergency Medicine

## 2013-12-17 DIAGNOSIS — K5900 Constipation, unspecified: Secondary | ICD-10-CM | POA: Insufficient documentation

## 2013-12-17 DIAGNOSIS — Z8711 Personal history of peptic ulcer disease: Secondary | ICD-10-CM | POA: Insufficient documentation

## 2013-12-17 DIAGNOSIS — R1013 Epigastric pain: Secondary | ICD-10-CM | POA: Insufficient documentation

## 2013-12-17 DIAGNOSIS — R109 Unspecified abdominal pain: Secondary | ICD-10-CM

## 2013-12-17 DIAGNOSIS — Z79899 Other long term (current) drug therapy: Secondary | ICD-10-CM | POA: Insufficient documentation

## 2013-12-17 DIAGNOSIS — R112 Nausea with vomiting, unspecified: Secondary | ICD-10-CM | POA: Insufficient documentation

## 2013-12-17 HISTORY — DX: Personal history of other diseases of the digestive system: Z87.19

## 2013-12-17 HISTORY — DX: Personal history of peptic ulcer disease: Z87.11

## 2013-12-17 LAB — COMPREHENSIVE METABOLIC PANEL
ALT: 48 U/L — AB (ref 0–35)
AST: 31 U/L (ref 0–37)
Albumin: 4 g/dL (ref 3.5–5.2)
Alkaline Phosphatase: 57 U/L (ref 39–117)
BILIRUBIN TOTAL: 0.2 mg/dL — AB (ref 0.3–1.2)
BUN: 14 mg/dL (ref 6–23)
CHLORIDE: 100 meq/L (ref 96–112)
CO2: 25 meq/L (ref 19–32)
Calcium: 9.1 mg/dL (ref 8.4–10.5)
Creatinine, Ser: 0.66 mg/dL (ref 0.50–1.10)
GFR calc Af Amer: >90 mL/min (ref >90)
Glucose, Bld: 72 mg/dL (ref 70–99)
Potassium: 3.8 mEq/L (ref 3.7–5.3)
SODIUM: 138 meq/L (ref 137–147)
Total Protein: 7.9 g/dL (ref 6.0–8.3)

## 2013-12-17 LAB — CBC WITH DIFFERENTIAL/PLATELET
BASOS ABS: 0 10*3/uL (ref 0.0–0.1)
Basophils Relative: 0 % (ref 0–1)
Eosinophils Absolute: 0.3 10*3/uL (ref 0.0–0.7)
Eosinophils Relative: 3 % (ref 0–5)
HEMATOCRIT: 43 % (ref 36.0–46.0)
Hemoglobin: 14.8 g/dL (ref 12.0–15.0)
LYMPHS PCT: 34 % (ref 12–46)
Lymphs Abs: 3.2 10*3/uL (ref 0.7–4.0)
MCH: 31.5 pg (ref 26.0–34.0)
MCHC: 34.4 g/dL (ref 30.0–36.0)
MCV: 91.5 fL (ref 78.0–100.0)
Monocytes Absolute: 0.8 10*3/uL (ref 0.1–1.0)
Monocytes Relative: 8 % (ref 3–12)
NEUTROS ABS: 5.1 10*3/uL (ref 1.7–7.7)
Neutrophils Relative %: 55 % (ref 43–77)
PLATELETS: 195 10*3/uL (ref 150–400)
RBC: 4.7 MIL/uL (ref 3.87–5.11)
RDW: 12.4 % (ref 11.5–15.5)
WBC: 9.4 10*3/uL (ref 4.0–10.5)

## 2013-12-17 MED ORDER — ONDANSETRON HCL 4 MG/2ML IJ SOLN
4.0000 mg | Freq: Once | INTRAMUSCULAR | Status: AC
  Administered 2013-12-17: 4 mg via INTRAVENOUS

## 2013-12-17 MED ORDER — SODIUM CHLORIDE 0.9 % IV BOLUS (SEPSIS)
1000.0000 mL | Freq: Once | INTRAVENOUS | Status: AC
  Administered 2013-12-17: 1000 mL via INTRAVENOUS

## 2013-12-17 MED ORDER — ONDANSETRON HCL 4 MG/2ML IJ SOLN
4.0000 mg | Freq: Once | INTRAMUSCULAR | Status: DC
  Filled 2013-12-17: qty 2

## 2013-12-17 MED ORDER — HYDROMORPHONE HCL PF 1 MG/ML IJ SOLN
0.5000 mg | Freq: Once | INTRAMUSCULAR | Status: AC
  Administered 2013-12-17: 0.5 mg via INTRAVENOUS
  Filled 2013-12-17: qty 1

## 2013-12-17 MED ORDER — GI COCKTAIL ~~LOC~~
30.0000 mL | Freq: Once | ORAL | Status: AC
  Administered 2013-12-17: 30 mL via ORAL
  Filled 2013-12-17: qty 30

## 2013-12-17 NOTE — ED Notes (Signed)
Patient vomiting in room.

## 2013-12-17 NOTE — ED Provider Notes (Signed)
CSN: [id number]     Arrival date & time 12/17/13  2018 History   None    Chief Complaint  Patient presents with  . Abdominal Pain   The history is provided by the patient. No language interpreter was used.   HPI Comments: Linda Dyer is a 23 y.o. female with a h/o stomach ulcers who presents to the Emergency Department complaining of upper abdominal pain with nausea and vomiting that started 6 months ago but worsened in the last 3-4 days. She saw a GI 6 months ago who found ulcers and treated them but they came back one month later. She also reports sore throat and generalized weakness. She states she has not been able to keep food down in the last few days. She is otherwise healthy.   Past Medical History  Diagnosis Date  . Constipation     around age 80  . History of stomach ulcers    Past Surgical History  Procedure Laterality Date  . Left arm    . Fracture surgery     History reviewed. No pertinent family history. History  Substance Use Topics  . Smoking status: Never Smoker   . Smokeless tobacco: Not on file  . Alcohol Use: No   OB History   Grav Para Term Preterm Abortions TAB SAB Ect Mult Living                 Review of Systems  Constitutional:       Per HPI, otherwise negative  HENT:       Per HPI, otherwise negative  Respiratory:       Per HPI, otherwise negative  Cardiovascular:       Per HPI, otherwise negative  Gastrointestinal: Negative for vomiting.  Endocrine:       Negative aside from HPI  Genitourinary:       Neg aside from HPI   Musculoskeletal:       Per HPI, otherwise negative  Skin: Negative.   Neurological: Negative for syncope.    Allergies  Review of patient's allergies indicates no known allergies.  Home Medications   Current Outpatient Rx  Name  Route  Sig  Dispense  Refill  . bisacodyl (DULCOLAX) 5 MG EC tablet   Oral   Take 5 mg by mouth once as needed.         . citalopram (CELEXA) 20 MG tablet   Oral   Take 20 mg  by mouth at bedtime.           . docusate sodium (COLACE) 100 MG capsule   Oral   Take 1 capsule (100 mg total) by mouth every 12 (twelve) hours.   60 capsule   0   . etonogestrel (IMPLANON) 68 MG IMPL implant   Subcutaneous   Inject 1 each into the skin once.           Marland Kitchen levonorgestrel-ethinyl estradiol (AVIANE,ALESSE,LESSINA) 0.1-20 MG-MCG tablet   Oral   Take 1 tablet by mouth daily.         . ondansetron (ZOFRAN) 4 MG tablet   Oral   Take 1 tablet (4 mg total) by mouth every 6 (six) hours.   12 tablet   0   . Sodium Phosphates (ENEMA RE)   Rectal   Place 1 Bottle rectally once as needed.          BP 121/61  Pulse 72  Temp(Src) 98.3 F (36.8 C) (Oral)  Resp 18  Ht  5\' 11"  (1.803 m)  Wt 195 lb (88.451 kg)  BMI 27.21 kg/m2  SpO2 98%  LMP 12/09/2013 Physical Exam  Nursing note and vitals reviewed. Constitutional: She is oriented to person, place, and time. She appears well-developed and well-nourished. No distress.  HENT:  Head: Normocephalic and atraumatic.  Eyes: Conjunctivae and EOM are normal.  Cardiovascular: Normal rate and regular rhythm.   Pulmonary/Chest: Effort normal and breath sounds normal. No stridor. No respiratory distress.  Abdominal: She exhibits no distension. There is tenderness (epigastric).  Musculoskeletal: She exhibits no edema.  Neurological: She is alert and oriented to person, place, and time. No cranial nerve deficit.  Skin: Skin is warm and dry.  Psychiatric: She has a normal mood and affect.    ED Course  Procedures (including critical care time) Medications  ondansetron (ZOFRAN) injection 4 mg (4 mg Intravenous Given 12/17/13 2234)   COORDINATION OF CARE: 10:36 PM- Discussed treatment plan with pt. Pt agrees to plan.    Labs Review Labs Reviewed  COMPREHENSIVE METABOLIC PANEL - Abnormal; Notable for the following:    ALT 48 (*)    Total Bilirubin 0.2 (*)    All other components within normal limits  CBC WITH  DIFFERENTIAL   Imaging Review No results found.  EKG Interpretation   None      I personally performed the services described in this documentation, which was scribed in my presence. The recorded information has been reviewed and is accurate.     MDM   1. Abdominal pain    this young female with history of esophageal/gastric disease presents with ongoing abdominal pain.  On exam she is awake and alert, with no peritoneal abdomen, no fever.  Patient has substantial improvement with analgesia here.  Patient has a gastroenterologist, with interval followup tomorrow.  With her referring labs, physical exam, capacity to follow up she was discharged in stable condition.  Gerhard Munchobert Lysle Yero, MD 12/18/13 [phone]

## 2013-12-17 NOTE — ED Notes (Signed)
Upper abd pain,burning of throat, n/v,  Diarrhea yesterday.

## 2013-12-18 MED ORDER — HYDROMORPHONE HCL PF 1 MG/ML IJ SOLN
0.5000 mg | Freq: Once | INTRAMUSCULAR | Status: AC
  Administered 2013-12-18: 0.5 mg via INTRAVENOUS
  Filled 2013-12-18: qty 1

## 2013-12-18 MED ORDER — LIDOCAINE VISCOUS 2 % MT SOLN
15.0000 mL | Freq: Once | OROMUCOSAL | Status: AC
  Administered 2013-12-18: 15 mL via OROMUCOSAL
  Filled 2013-12-18: qty 15

## 2013-12-18 MED ORDER — GI COCKTAIL ~~LOC~~: 30.0000 mL | Freq: Three times a day (TID) | ORAL | Status: DC | PRN

## 2013-12-18 MED ORDER — LIDOCAINE VISCOUS 2 % MT SOLN: 20.0000 mL | Freq: Four times a day (QID) | OROMUCOSAL | Status: DC | PRN

## 2013-12-18 NOTE — Discharge Instructions (Signed)
As discussed, it is important that you follow up as soon as possible with your physician for continued management of your condition.  If you develop any new, or concerning changes in your condition, please return to the emergency department immediately.  Abdominal Pain, Women Abdominal (stomach, pelvic, or belly) pain can be caused by many things. It is important to tell your doctor:  The location of the pain.  Does it come and go or is it present all the time?  Are there things that start the pain (eating certain foods, exercise)?  Are there other symptoms associated with the pain (fever, nausea, vomiting, diarrhea)? All of this is helpful to know when trying to find the cause of the pain. CAUSES   Stomach: virus or bacteria infection, or ulcer.  Intestine: appendicitis (inflamed appendix), regional ileitis (Crohn's disease), ulcerative colitis (inflamed colon), irritable bowel syndrome, diverticulitis (inflamed diverticulum of the colon), or cancer of the stomach or intestine.  Gallbladder disease or stones in the gallbladder.  Kidney disease, kidney stones, or infection.  Pancreas infection or cancer.  Fibromyalgia (pain disorder).  Diseases of the female organs:  Uterus: fibroid (non-cancerous) tumors or infection.  Fallopian tubes: infection or tubal pregnancy.  Ovary: cysts or tumors.  Pelvic adhesions (scar tissue).  Endometriosis (uterus lining tissue growing in the pelvis and on the pelvic organs).  Pelvic congestion syndrome (female organs filling up with blood just before the menstrual period).  Pain with the menstrual period.  Pain with ovulation (producing an egg).  Pain with an IUD (intrauterine device, birth control) in the uterus.  Cancer of the female organs.  Functional pain (pain not caused by a disease, may improve without treatment).  Psychological pain.  Depression. DIAGNOSIS  Your doctor will decide the seriousness of your pain by doing  an examination.  Blood tests.  X-rays.  Ultrasound.  CT scan (computed tomography, special type of X-ray).  MRI (magnetic resonance imaging).  Cultures, for infection.  Barium enema (dye inserted in the large intestine, to better view it with X-rays).  Colonoscopy (looking in intestine with a lighted tube).  Laparoscopy (minor surgery, looking in abdomen with a lighted tube).  Major abdominal exploratory surgery (looking in abdomen with a large incision). TREATMENT  The treatment will depend on the cause of the pain.   Many cases can be observed and treated at home.  Over-the-counter medicines recommended by your caregiver.  Prescription medicine.  Antibiotics, for infection.  Birth control pills, for painful periods or for ovulation pain.  Hormone treatment, for endometriosis.  Nerve blocking injections.  Physical therapy.  Antidepressants.  Counseling with a psychologist or psychiatrist.  Minor or major surgery. HOME CARE INSTRUCTIONS   Do not take laxatives, unless directed by your caregiver.  Take over-the-counter pain medicine only if ordered by your caregiver. Do not take aspirin because it can cause an upset stomach or bleeding.  Try a clear liquid diet (broth or water) as ordered by your caregiver. Slowly move to a bland diet, as tolerated, if the pain is related to the stomach or intestine.  Have a thermometer and take your temperature several times a day, and record it.  Bed rest and sleep, if it helps the pain.  Avoid sexual intercourse, if it causes pain.  Avoid stressful situations.  Keep your follow-up appointments and tests, as your caregiver orders.  If the pain does not go away with medicine or surgery, you may try:  Acupuncture.  Relaxation exercises (yoga, meditation).  Group therapy.  Counseling. SEEK MEDICAL CARE IF:   You notice certain foods cause stomach pain.  Your home care treatment is not helping your pain.  You  need stronger pain medicine.  You want your IUD removed.  You feel faint or lightheaded.  You develop nausea and vomiting.  You develop a rash.  You are having side effects or an allergy to your medicine. SEEK IMMEDIATE MEDICAL CARE IF:   Your pain does not go away or gets worse.  You have a fever.  Your pain is felt only in portions of the abdomen. The right side could possibly be appendicitis. The left lower portion of the abdomen could be colitis or diverticulitis.  You are passing blood in your stools (bright red or black tarry stools, with or without vomiting).  You have blood in your urine.  You develop chills, with or without a fever.  You pass out. MAKE SURE YOU:   Understand these instructions.  Will watch your condition.  Will get help right away if you are not doing well or get worse. Document Released: 09/02/2007 Document Revised: 01/28/2012 Document Reviewed: 09/22/2009 [facility] Patient Information 2014 [place], [place].

## 2014-02-18 ENCOUNTER — Emergency Department (HOSPITAL_COMMUNITY)
Admission: EM | Admit: 2014-02-18 | Discharge: 2014-02-18 | Disposition: A | Discharge status: Home / Self Care | Payer: Medicaid Other | Attending: Emergency Medicine | Admitting: Emergency Medicine

## 2014-02-18 ENCOUNTER — Encounter (HOSPITAL_COMMUNITY): Payer: Self-pay | Admitting: Emergency Medicine

## 2014-02-18 DIAGNOSIS — Z8719 Personal history of other diseases of the digestive system: Secondary | ICD-10-CM | POA: Insufficient documentation

## 2014-02-18 DIAGNOSIS — K0889 Other specified disorders of teeth and supporting structures: Secondary | ICD-10-CM

## 2014-02-18 DIAGNOSIS — Z79899 Other long term (current) drug therapy: Secondary | ICD-10-CM | POA: Insufficient documentation

## 2014-02-18 DIAGNOSIS — K089 Disorder of teeth and supporting structures, unspecified: Secondary | ICD-10-CM | POA: Insufficient documentation

## 2014-02-18 MED ORDER — HYDROCODONE-ACETAMINOPHEN 5-325 MG PO TABS: 1.0000 | ORAL_TABLET | ORAL | Status: DC | PRN

## 2014-02-18 MED ORDER — AMOXICILLIN 500 MG PO CAPS: 500.0000 mg | ORAL_CAPSULE | Freq: Three times a day (TID) | ORAL | Status: DC

## 2014-02-18 MED ORDER — HYDROCODONE-ACETAMINOPHEN 5-325 MG PO TABS
ORAL_TABLET | ORAL | Status: AC
  Filled 2014-02-18: qty 1

## 2014-02-18 MED ORDER — IBUPROFEN 800 MG PO TABS: 800.0000 mg | ORAL_TABLET | Freq: Three times a day (TID) | ORAL | Status: DC

## 2014-02-18 MED ORDER — IBUPROFEN 800 MG PO TABS
ORAL_TABLET | ORAL | Status: AC
  Filled 2014-02-18: qty 1

## 2014-02-18 MED ORDER — IBUPROFEN 800 MG PO TABS
800.0000 mg | ORAL_TABLET | Freq: Once | ORAL | Status: AC
  Administered 2014-02-18: 800 mg via ORAL

## 2014-02-18 MED ORDER — HYDROCODONE-ACETAMINOPHEN 5-325 MG PO TABS
1.0000 | ORAL_TABLET | Freq: Once | ORAL | Status: AC
  Administered 2014-02-18: 1 via ORAL

## 2014-02-18 NOTE — ED Provider Notes (Signed)
CSN: [id number]     Arrival date & time 02/18/14  1228 History   First MD Initiated Contact with Patient 02/18/14 1412     Chief Complaint  Patient presents with  . Dental Pain     (Consider location/radiation/quality/duration/timing/severity/associated sxs/prior Treatment) Patient is a 23 y.o. female presenting with tooth pain. The history is provided by the patient.  Dental Pain Location:  Lower Quality:  Throbbing Severity:  Moderate Onset quality:  Gradual Duration:  3 days Timing:  Intermittent Progression:  Worsening Chronicity: acute on chronic. Context: poor dentition   Relieved by:  Nothing Worsened by:  Cold food/drink Ineffective treatments:  Acetaminophen Associated symptoms: facial pain and gum swelling   Associated symptoms: no neck pain   Risk factors: lack of dental care   Risk factors: no alcohol problem and no smoking     Past Medical History  Diagnosis Date  . Constipation     around age 23  . History of stomach ulcers    Past Surgical History  Procedure Laterality Date  . Left arm    . Fracture surgery     History reviewed. No pertinent family history. History  Substance Use Topics  . Smoking status: Never Smoker   . Smokeless tobacco: Not on file  . Alcohol Use: No   OB History   Grav Para Term Preterm Abortions TAB SAB Ect Mult Living                 Review of Systems  Constitutional: Negative for activity change.       All ROS Neg except as noted in HPI  HENT: Positive for dental problem. Negative for nosebleeds.   Eyes: Negative for photophobia and discharge.  Respiratory: Negative for cough, shortness of breath and wheezing.   Cardiovascular: Negative for chest pain and palpitations.  Gastrointestinal: Negative for abdominal pain and blood in stool.  Genitourinary: Negative for dysuria, frequency and hematuria.  Musculoskeletal: Negative for arthralgias, back pain and neck pain.  Skin: Negative.   Neurological: Negative for  dizziness, seizures and speech difficulty.  Psychiatric/Behavioral: Negative for hallucinations and confusion.      Allergies  Review of patient's allergies indicates no known allergies.  Home Medications   Current Outpatient Rx  Name  Route  Sig  Dispense  Refill  . citalopram (CELEXA) 20 MG tablet   Oral   Take 20 mg by mouth at bedtime.           Marland Kitchen levonorgestrel-ethinyl estradiol (AVIANE,ALESSE,LESSINA) 0.1-20 MG-MCG tablet   Oral   Take 1 tablet by mouth daily.          BP 127/75  Pulse 82  Temp(Src) 97.8 F (36.6 C) (Oral)  Resp 16  Ht 5\' 11"  (1.803 m)  Wt 195 lb (88.451 kg)  BMI 27.21 kg/m2  LMP 02/10/2014 Physical Exam  Nursing note and vitals reviewed. Constitutional: She is oriented to person, place, and time. She appears well-developed and well-nourished.  Non-toxic appearance.  HENT:  Head: Normocephalic.  Right Ear: Tympanic membrane and external ear normal.  Left Ear: Tympanic membrane and external ear normal.  There is mild swelling around the gums of the right and left lower molar areas. No abscess appreciated. No swelling under the tongue. Airway is patent.  Eyes: EOM and lids are normal. Pupils are equal, round, and reactive to light.  Neck: Normal range of motion. Neck supple. Carotid bruit is not present.  Cardiovascular: Normal rate, regular rhythm, normal heart sounds, intact distal  pulses and normal pulses.   Pulmonary/Chest: Breath sounds normal. No respiratory distress.  Abdominal: Soft. Bowel sounds are normal. There is no tenderness. There is no guarding.  Musculoskeletal: Normal range of motion.  Lymphadenopathy:       Head (right side): No submandibular adenopathy present.       Head (left side): No submandibular adenopathy present.    She has no cervical adenopathy.  Neurological: She is alert and oriented to person, place, and time. She has normal strength. No cranial nerve deficit or sensory deficit.  Skin: Skin is warm and dry.   Psychiatric: She has a normal mood and affect. Her speech is normal.    ED Course  Procedures (including critical care time) Labs Review Labs Reviewed - No data to display Imaging Review No results found.   EKG Interpretation None      MDM Pt has pain of the lower molar areas. No abscess noted.  Vital signs stable. RX for norco, ibuprofen, and amoxil given to the patient.   Final diagnoses:  None    **I have reviewed nursing notes, vital signs, and all appropriate lab and imaging results for this patient.Kathie Dike*    Chester Romero M Tearia Gibbs, PA-C 02/18/14 1605

## 2014-02-18 NOTE — Discharge Instructions (Signed)
Toothache  Toothaches are usually caused by tooth decay (cavity). However, other causes of toothache include:  · Gum disease.  · Cracked tooth.  · Cracked filling.  · Injury.  · Jaw problem (temporo mandibular joint or TMJ disorder).  · Tooth abscess.  · Root sensitivity.  · Grinding.  · Eruption problems.  Swelling and redness around a painful tooth often means you have a dental abscess.  Pain medicine and antibiotics can help reduce symptoms, but you will need to see a dentist within the next few days to have your problem properly evaluated and treated. If tooth decay is the problem, you may need a filling or root canal to save your tooth. If the problem is more severe, your tooth may need to be pulled.  SEEK IMMEDIATE MEDICAL CARE IF:  · You cannot swallow.  · You develop severe swelling, increased redness, or increased pain in your mouth or face.  · You have a fever.  · You cannot open your mouth adequately.  Document Released: 12/13/2004 Document Revised: 01/28/2012 Document Reviewed: 02/02/2010  Exit[employer]® Patient Information ©2014 [employer].

## 2014-02-18 NOTE — ED Notes (Signed)
C/O R. Lower tooth pain (filling came out and part of tooth broke off) and L. upper tooth pain. States dentist cannot see her until next week. Has tried Ibuprofen, Tylenol, and Orajel for pain but no relief experienced.

## 2014-02-18 NOTE — ED Notes (Signed)
Pt c/o bilateral lower toothaches x 3 days. Unable to see dentist until next week.

## 2014-02-18 NOTE — ED Provider Notes (Signed)
Medical screening examination/treatment/procedure(s) were performed by non-physician practitioner and as supervising physician I was immediately available for consultation/collaboration.   EKG Interpretation None        Coda Mathey M CampLyanne Coos, MD 02/18/14 [phone]

## 2014-07-24 ENCOUNTER — Emergency Department (HOSPITAL_COMMUNITY)
Admission: EM | Admit: 2014-07-24 | Discharge: 2014-07-25 | Disposition: A | Discharge status: Home / Self Care | Payer: Medicaid Other | Attending: Emergency Medicine | Admitting: Emergency Medicine

## 2014-07-24 ENCOUNTER — Encounter (HOSPITAL_COMMUNITY): Payer: Self-pay | Admitting: Emergency Medicine

## 2014-07-24 DIAGNOSIS — N898 Other specified noninflammatory disorders of vagina: Secondary | ICD-10-CM | POA: Diagnosis not present

## 2014-07-24 DIAGNOSIS — F17200 Nicotine dependence, unspecified, uncomplicated: Secondary | ICD-10-CM | POA: Diagnosis not present

## 2014-07-24 DIAGNOSIS — R109 Unspecified abdominal pain: Secondary | ICD-10-CM | POA: Diagnosis present

## 2014-07-24 DIAGNOSIS — Z8719 Personal history of other diseases of the digestive system: Secondary | ICD-10-CM | POA: Insufficient documentation

## 2014-07-24 DIAGNOSIS — N949 Unspecified condition associated with female genital organs and menstrual cycle: Secondary | ICD-10-CM | POA: Insufficient documentation

## 2014-07-24 DIAGNOSIS — Z79899 Other long term (current) drug therapy: Secondary | ICD-10-CM | POA: Diagnosis not present

## 2014-07-24 DIAGNOSIS — R102 Pelvic and perineal pain: Secondary | ICD-10-CM

## 2014-07-24 LAB — SAMPLE TO BLOOD BANK

## 2014-07-24 MED ORDER — ONDANSETRON 8 MG PO TBDP
8.0000 mg | ORAL_TABLET | Freq: Once | ORAL | Status: AC
  Administered 2014-07-24: 8 mg via ORAL
  Filled 2014-07-24: qty 1

## 2014-07-24 MED ORDER — MORPHINE SULFATE 4 MG/ML IJ SOLN
4.0000 mg | Freq: Once | INTRAMUSCULAR | Status: DC
  Filled 2014-07-24: qty 1

## 2014-07-24 MED ORDER — MORPHINE SULFATE 4 MG/ML IJ SOLN
4.0000 mg | Freq: Once | INTRAMUSCULAR | Status: AC
  Administered 2014-07-24: 4 mg via INTRAVENOUS

## 2014-07-24 MED ORDER — HYDROMORPHONE HCL PF 1 MG/ML IJ SOLN
1.0000 mg | Freq: Once | INTRAMUSCULAR | Status: AC
  Administered 2014-07-24: 1 mg via INTRAVENOUS
  Filled 2014-07-24: qty 1

## 2014-07-25 ENCOUNTER — Other Ambulatory Visit (HOSPITAL_COMMUNITY): Payer: Self-pay | Admitting: Emergency Medicine

## 2014-07-25 ENCOUNTER — Ambulatory Visit (HOSPITAL_COMMUNITY): Payer: MEDICAID

## 2014-07-25 ENCOUNTER — Inpatient Hospital Stay (HOSPITAL_COMMUNITY): Admit: 2014-07-25 | Payer: MEDICAID

## 2014-07-25 DIAGNOSIS — R102 Pelvic and perineal pain: Secondary | ICD-10-CM

## 2014-07-25 DIAGNOSIS — N939 Abnormal uterine and vaginal bleeding, unspecified: Secondary | ICD-10-CM

## 2014-07-25 LAB — CBC WITH DIFFERENTIAL/PLATELET
BASOS ABS: 0 10*3/uL (ref 0.0–0.1)
BASOS PCT: 0 % (ref 0–1)
EOS PCT: 2 % (ref 0–5)
Eosinophils Absolute: 0.2 10*3/uL (ref 0.0–0.7)
HEMATOCRIT: 34.5 % — AB (ref 36.0–46.0)
Hemoglobin: 12 g/dL (ref 12.0–15.0)
LYMPHS PCT: 20 % (ref 12–46)
Lymphs Abs: 1.6 10*3/uL (ref 0.7–4.0)
MCH: 31.6 pg (ref 26.0–34.0)
MCHC: 34.8 g/dL (ref 30.0–36.0)
MCV: 90.8 fL (ref 78.0–100.0)
MONO ABS: 0.6 10*3/uL (ref 0.1–1.0)
Monocytes Relative: 8 % (ref 3–12)
Neutro Abs: 5.7 10*3/uL (ref 1.7–7.7)
Neutrophils Relative %: 70 % (ref 43–77)
PLATELETS: 171 10*3/uL (ref 150–400)
RBC: 3.8 MIL/uL — ABNORMAL LOW (ref 3.87–5.11)
RDW: 12.3 % (ref 11.5–15.5)
WBC: 8.2 10*3/uL (ref 4.0–10.5)

## 2014-07-25 MED ORDER — OXYCODONE-ACETAMINOPHEN 5-325 MG PO TABS: 2.0000 | ORAL_TABLET | ORAL | Status: DC | PRN

## 2014-07-25 MED ORDER — OXYCODONE-ACETAMINOPHEN 5-325 MG PO TABS: 1.0000 | ORAL_TABLET | ORAL | Status: DC | PRN

## 2014-07-25 NOTE — ED Notes (Signed)
Pt did not show up for [place] today, attempted to call pt but the number listed is not a working number.  Notified Dr. Jeraldine Loots.

## 2014-07-25 NOTE — Discharge Instructions (Signed)
Pelvic Pain Female pelvic pain can be caused by many different things and start from a variety of places. Pelvic pain refers to pain that is located in the lower half of the abdomen and between your hips. The pain may occur over a short period of time (acute) or may be reoccurring (chronic). The cause of pelvic pain may be related to disorders affecting the female reproductive organs (gynecologic), but it may also be related to the bladder, kidney stones, an intestinal complication, or muscle or skeletal problems. Getting help right away for pelvic pain is important, especially if there has been severe, sharp, or a sudden onset of unusual pain. It is also important to get help right away because some types of pelvic pain can be life threatening.  CAUSES  Below are only some of the causes of pelvic pain. The causes of pelvic pain can be in one of several categories.   Gynecologic.  Pelvic inflammatory disease.  Sexually transmitted infection.  Ovarian cyst or a twisted ovarian ligament (ovarian torsion).  Uterine lining that grows outside the uterus (endometriosis).  Fibroids, cysts, or tumors.  Ovulation.  Pregnancy.  Pregnancy that occurs outside the uterus (ectopic pregnancy).  Miscarriage.  Labor.  Abruption of the placenta or ruptured uterus.  Infection.  Uterine infection (endometritis).  Bladder infection.  Diverticulitis.  Miscarriage related to a uterine infection (septic abortion).  Bladder.  Inflammation of the bladder (cystitis).  Kidney stone(s).  Gastrointestinal.  Constipation.  Diverticulitis.  Neurologic.  Trauma.  Feeling pelvic pain because of mental or emotional causes (psychosomatic).  Cancers of the bowel or pelvis. EVALUATION  Your caregiver will want to take a careful history of your concerns. This includes recent changes in your health, a careful gynecologic history of your periods (menses), and a sexual history. Obtaining your family  history and medical history is also important. Your caregiver may suggest a pelvic exam. A pelvic exam will help identify the location and severity of the pain. It also helps in the evaluation of which organ system may be involved. In order to identify the cause of the pelvic pain and be properly treated, your caregiver may order tests. These tests may include:   A pregnancy test.  Pelvic ultrasonography.  An X-ray exam of the abdomen.  A urinalysis or evaluation of vaginal discharge.  Blood tests. HOME CARE INSTRUCTIONS   Only take over-the-counter or prescription medicines for pain, discomfort, or fever as directed by your caregiver.   Rest as directed by your caregiver.   Eat a balanced diet.   Drink enough fluids to make your urine clear or pale yellow, or as directed.   Avoid sexual intercourse if it causes pain.   Apply warm or cold compresses to the lower abdomen depending on which one helps the pain.   Avoid stressful situations.   Keep a journal of your pelvic pain. Write down when it started, where the pain is located, and if there are things that seem to be associated with the pain, such as food or your menstrual cycle.  Follow up with your caregiver as directed.  SEEK MEDICAL CARE IF:  Your medicine does not help your pain.  You have abnormal vaginal discharge. SEEK IMMEDIATE MEDICAL CARE IF:   You have heavy bleeding from the vagina.   Your pelvic pain increases.   You feel light-headed or faint.   You have chills.   You have pain with urination or blood in your urine.   You have uncontrolled diarrhea   or vomiting.   You have a fever or persistent symptoms for more than 3 days.  You have a fever and your symptoms suddenly get worse.   You are being physically or sexually abused.  MAKE SURE YOU:  Understand these instructions.  Will watch your condition.  Will get help if you are not doing well or get worse. Document Released:  10/02/2004 Document Revised: 03/22/2014 Document Reviewed: 02/25/2012 [facility] Patient Information 2015 [place], [place]. This information is not intended to replace advice given to you by your health care provider. Make sure you discuss any questions you have with your health care provider.   Return tomorrow as instructed for an ultrasound to further evaluate your pain.  You may take the oxycodone prescribed for pain relief.  This will make you drowsy - do not drive within 4 hours of taking this medication.

## 2014-07-26 NOTE — ED Provider Notes (Signed)
CSN: [id number]     Arrival date & time 07/24/14  2124 History   First MD Initiated Contact with Patient 07/24/14 2144     Chief Complaint  Patient presents with  . Abdominal Pain     (Consider location/radiation/quality/duration/timing/severity/associated sxs/prior Treatment) The history is provided by the patient.   Linda Dyer is a 23 y.o. female  G2P1A1 who was [redacted] weeks pregnant and  had an elective abortion 4 days ago, presenting with pelvic cramping and vaginal bleeding.  She reports just spotting the first 3 days after the procedure, then today started having heavier bleeding with passage of several clots and has had pelvic pain and cramping she had to change her pad every hour for the past 2 hours and so presented here for further assessment.  She denies fevers, chills, nausea, dizziness or weakness.  She has taken tylenol and ibuprofen without relief of symptoms.     Past Medical History  Diagnosis Date  . Constipation     around age 54  . History of stomach ulcers    Past Surgical History  Procedure Laterality Date  . Left arm    . Fracture surgery     History reviewed. No pertinent family history. History  Substance Use Topics  . Smoking status: Current Every Day Smoker -- 0.50 packs/day  . Smokeless tobacco: Not on file  . Alcohol Use: No   OB History   Grav Para Term Preterm Abortions TAB SAB Ect Mult Living                 Review of Systems  Constitutional: Negative for fever.  HENT: Negative for congestion and sore throat.   Eyes: Negative.   Respiratory: Negative for chest tightness and shortness of breath.   Cardiovascular: Negative for chest pain.  Gastrointestinal: Negative for nausea and abdominal pain.  Genitourinary: Positive for vaginal bleeding and pelvic pain.  Musculoskeletal: Negative for arthralgias, back pain, joint swelling and neck pain.  Skin: Negative.  Negative for rash and wound.  Neurological: Negative for dizziness, weakness,  light-headedness, numbness and headaches.  Psychiatric/Behavioral: Negative.       Allergies  Review of patient's allergies indicates no known allergies.  Home Medications   Prior to Admission medications   Medication Sig Start Date End Date Taking? Authorizing Provider  acetaminophen (TYLENOL) 500 MG tablet Take 500 mg by mouth every 6 (six) hours as needed.   Yes Historical Provider, MD  citalopram (CELEXA) 20 MG tablet Take 20 mg by mouth at bedtime.     Yes Historical Provider, MD  ibuprofen (ADVIL,MOTRIN) 200 MG tablet Take 200 mg by mouth every 6 (six) hours as needed.   Yes Historical Provider, MD  levonorgestrel-ethinyl estradiol (AVIANE,ALESSE,LESSINA) 0.1-20 MG-MCG tablet Take 1 tablet by mouth daily.   Yes Historical Provider, MD  oxyCODONE-acetaminophen (PERCOCET/ROXICET) 5-325 MG per tablet Take 2 tablets by mouth every 4 (four) hours as needed for moderate pain or severe pain. 07/25/14   Burgess Amor, PA-C  oxyCODONE-acetaminophen (PERCOCET/ROXICET) 5-325 MG per tablet Take 1 tablet by mouth every 4 (four) hours as needed. 07/25/14   Burgess Amor, PA-C   BP 117/67  Pulse 76  Temp(Src) 98.8 F (37.1 C) (Oral)  Resp 20  Ht  (1.803 m)  Wt 195 lb (88.451 kg)  BMI 27.21 kg/m2  SpO2 97%  LMP 07/22/2014 Physical Exam  Nursing note and vitals reviewed. Constitutional: She appears well-developed and well-nourished.  HENT:  Head: Normocephalic and atraumatic.  Eyes:  Conjunctivae are normal.  Neck: Normal range of motion.  Cardiovascular: Normal rate, regular rhythm, normal heart sounds and intact distal pulses.   Pulmonary/Chest: Effort normal and breath sounds normal. She has no wheezes.  Abdominal: Soft. Bowel sounds are normal. She exhibits no distension. There is no tenderness. There is no guarding.  Genitourinary: Uterus is tender. Cervix exhibits no motion tenderness. Right adnexum displays no tenderness and no fullness. Left adnexum displays no tenderness and no  fullness. There is bleeding around the vagina. No signs of injury around the vagina. No vaginal discharge found.  Mild to moderate amount of blood in vagina.  No clots, no active bleeding from cervix.  Musculoskeletal: Normal range of motion.  Neurological: She is alert.  Skin: Skin is warm and dry.  Psychiatric: She has a normal mood and affect.    ED Course  Procedures (including critical care time) Labs Review Labs Reviewed  CBC WITH DIFFERENTIAL - Abnormal; Notable for the following:    RBC 3.80 (*)    HCT 34.5 (*)    All other components within normal limits  SAMPLE TO BLOOD BANK    Imaging Review No results found.   EKG Interpretation None      MDM   Final diagnoses:  Pelvic pain in female    Patients labs and/or radiological studies were viewed and considered during the medical decision making and disposition process. Labs stable, patient in no acute distress, exam reassuring.  Suspect pt is passing products of conception from recent procedure.  Pt was scheduled to return in am for [place] for completion of work up.  Pt understands and agrees with plain.  She was also advised she needs f/u with gyn within 1 week - her gyn is in [place].  She was prescribed oxycodone for pain.    Discussed with Dr Lynelle Doctor prior to dc home.  Burgess Amor, PA-C 07/26/14 2304  Burgess Amor, PA-C 07/26/14 [phone]

## 2014-07-30 MED FILL — Oxycodone w/ Acetaminophen Tab 5-325 MG: ORAL | Qty: 6 | Status: AC

## 2014-07-30 NOTE — ED Provider Notes (Signed)
Medical screening examination/treatment/procedure(s) were performed by non-physician practitioner and as supervising physician I was immediately available for consultation/collaboration.   EKG Interpretation None      Mekai Wilkinson, MD, FACEP   Jackqulyn Mendel L Alaa Mullally, MD 07/30/14 1545 

## 2014-11-16 ENCOUNTER — Emergency Department (HOSPITAL_COMMUNITY)
Admission: EM | Admit: 2014-11-16 | Discharge: 2014-11-16 | Disposition: A | Discharge status: Home / Self Care | Payer: Medicaid Other | Attending: Emergency Medicine | Admitting: Emergency Medicine

## 2014-11-16 ENCOUNTER — Encounter (HOSPITAL_COMMUNITY): Payer: Self-pay | Admitting: Emergency Medicine

## 2014-11-16 ENCOUNTER — Emergency Department (HOSPITAL_COMMUNITY): Payer: Medicaid Other

## 2014-11-16 DIAGNOSIS — Z8719 Personal history of other diseases of the digestive system: Secondary | ICD-10-CM | POA: Diagnosis not present

## 2014-11-16 DIAGNOSIS — Z79899 Other long term (current) drug therapy: Secondary | ICD-10-CM | POA: Insufficient documentation

## 2014-11-16 DIAGNOSIS — Y9301 Activity, walking, marching and hiking: Secondary | ICD-10-CM | POA: Diagnosis not present

## 2014-11-16 DIAGNOSIS — Z720 Tobacco use: Secondary | ICD-10-CM | POA: Insufficient documentation

## 2014-11-16 DIAGNOSIS — S59912A Unspecified injury of left forearm, initial encounter: Secondary | ICD-10-CM | POA: Diagnosis not present

## 2014-11-16 DIAGNOSIS — S161XXA Strain of muscle, fascia and tendon at neck level, initial encounter: Secondary | ICD-10-CM | POA: Diagnosis not present

## 2014-11-16 DIAGNOSIS — S39012A Strain of muscle, fascia and tendon of lower back, initial encounter: Secondary | ICD-10-CM | POA: Diagnosis not present

## 2014-11-16 DIAGNOSIS — Y998 Other external cause status: Secondary | ICD-10-CM | POA: Insufficient documentation

## 2014-11-16 DIAGNOSIS — W01198A Fall on same level from slipping, tripping and stumbling with subsequent striking against other object, initial encounter: Secondary | ICD-10-CM | POA: Diagnosis not present

## 2014-11-16 DIAGNOSIS — Y9289 Other specified places as the place of occurrence of the external cause: Secondary | ICD-10-CM | POA: Insufficient documentation

## 2014-11-16 DIAGNOSIS — W19XXXA Unspecified fall, initial encounter: Secondary | ICD-10-CM

## 2014-11-16 DIAGNOSIS — S199XXA Unspecified injury of neck, initial encounter: Secondary | ICD-10-CM | POA: Diagnosis present

## 2014-11-16 MED ORDER — CYCLOBENZAPRINE HCL 10 MG PO TABS: 10.0000 mg | ORAL_TABLET | Freq: Three times a day (TID) | ORAL | Status: DC | PRN

## 2014-11-16 MED ORDER — HYDROCODONE-ACETAMINOPHEN 5-325 MG PO TABS
1.0000 | ORAL_TABLET | Freq: Once | ORAL | Status: AC
  Administered 2014-11-16: 1 via ORAL
  Filled 2014-11-16: qty 1

## 2014-11-16 MED ORDER — IBUPROFEN 600 MG PO TABS: 600.0000 mg | ORAL_TABLET | Freq: Three times a day (TID) | ORAL | Status: DC

## 2014-11-16 MED ORDER — IBUPROFEN 800 MG PO TABS
800.0000 mg | ORAL_TABLET | Freq: Once | ORAL | Status: AC
  Administered 2014-11-16: 800 mg via ORAL
  Filled 2014-11-16: qty 1

## 2014-11-16 MED ORDER — CYCLOBENZAPRINE HCL 10 MG PO TABS
10.0000 mg | ORAL_TABLET | Freq: Once | ORAL | Status: AC
  Administered 2014-11-16: 10 mg via ORAL
  Filled 2014-11-16: qty 1

## 2014-11-16 MED ORDER — HYDROCODONE-ACETAMINOPHEN 5-325 MG PO TABS
ORAL_TABLET | ORAL | Status: DC
Start: 2014-11-16 — End: 2014-12-15

## 2014-11-16 NOTE — ED Provider Notes (Signed)
CSN: [id number]     Arrival date & time 11/16/14  4098 History   First MD Initiated Contact with Patient 11/16/14 [phone]     Chief Complaint  Patient presents with  . Fall     (Consider location/radiation/quality/duration/timing/severity/associated sxs/prior Treatment) HPI  Linda Dyer is a 23 y.o. female who presents to the Emergency Department complaining of pain to her right neck, low back , and left forearm.  She states that she slipped and fell at 3 am this morning while walking her dog.  Reports that she fell back onto some steps.  Pain to her neck and back are worse with standing and movement.  She reports having "soreness" to her left forearm and has hx of previous fracture repair to her forearm.  She took tylenol at onset with some symptom relief.  She denies head injury, LOC, numbness or weakness, headache, chest or abdominal pain, shortness of breath or vomiting.     Past Medical History  Diagnosis Date  . Constipation     around age 45  . History of stomach ulcers    Past Surgical History  Procedure Laterality Date  . Left arm    . Fracture surgery     No family history on file. History  Substance Use Topics  . Smoking status: Current Every Day Smoker -- 0.50 packs/day  . Smokeless tobacco: Not on file  . Alcohol Use: No   OB History    No data available     Review of Systems  Constitutional: Negative for fever and chills.  Eyes: Negative for visual disturbance.  Respiratory: Negative for shortness of breath.   Cardiovascular: Negative for chest pain.  Gastrointestinal: Negative for nausea, vomiting and abdominal pain.  Genitourinary: Negative for dysuria, hematuria, flank pain and difficulty urinating.  Musculoskeletal: Positive for back pain, arthralgias (left forearm pain) and neck pain. Negative for joint swelling, gait problem and neck stiffness.  Skin: Negative for color change and wound.  Neurological: Negative for dizziness, syncope, facial  asymmetry, weakness, numbness and headaches.  Psychiatric/Behavioral: Negative for confusion.  All other systems reviewed and are negative.     Allergies  Review of patient's allergies indicates no known allergies.  Home Medications   Prior to Admission medications   Medication Sig Start Date End Date Taking? Authorizing Provider  acetaminophen (TYLENOL) 500 MG tablet Take 500 mg by mouth every 6 (six) hours as needed.    Historical Provider, MD  citalopram (CELEXA) 20 MG tablet Take 20 mg by mouth at bedtime.      Historical Provider, MD  ibuprofen (ADVIL,MOTRIN) 200 MG tablet Take 200 mg by mouth every 6 (six) hours as needed.    Historical Provider, MD  levonorgestrel-ethinyl estradiol (AVIANE,ALESSE,LESSINA) 0.1-20 MG-MCG tablet Take 1 tablet by mouth daily.    Historical Provider, MD  oxyCODONE-acetaminophen (PERCOCET/ROXICET) 5-325 MG per tablet Take 2 tablets by mouth every 4 (four) hours as needed for moderate pain or severe pain. 07/25/14   Burgess Amor, PA-C  oxyCODONE-acetaminophen (PERCOCET/ROXICET) 5-325 MG per tablet Take 1 tablet by mouth every 4 (four) hours as needed. 07/25/14   Burgess Amor, PA-C   BP 107/58 mmHg  Pulse 89  Temp(Src) 97.7 F (36.5 C) (Oral)  Resp 18  Ht 5\' 11"  (1.803 m)  Wt 215 lb (97.523 kg)  BMI 30.00 kg/m2  SpO2 99%  LMP 11/11/2014 Physical Exam  Constitutional: She is oriented to person, place, and time. She appears well-developed and well-nourished. No distress.  HENT:  Head: Normocephalic and atraumatic.  Eyes: Conjunctivae and EOM are normal. Pupils are equal, round, and reactive to light.  Neck: Phonation normal. Neck supple. Spinous process tenderness and muscular tenderness present. Normal range of motion present.  Cardiovascular: Normal rate, regular rhythm, normal heart sounds and intact distal pulses.   No murmur heard. Pulmonary/Chest: Effort normal and breath sounds normal. No respiratory distress. She exhibits no tenderness.    Abdominal: Soft. She exhibits no distension. There is no tenderness. There is no rebound and no guarding.  Musculoskeletal: She exhibits tenderness. She exhibits no edema.       Lumbar back: She exhibits tenderness and bony tenderness. She exhibits no swelling and no edema.       Back:  Tenderness of the lower lumbar spine and right paraspinal muscles and tenderness of the lower cervical spine and right paraspinal muscles.  No bony deformities.  No bruising or abrasions.  Patient has 5/5 strength against resistance of the bilateral upper and lower extremites.    Neurological: She is alert and oriented to person, place, and time. She exhibits normal muscle tone. Coordination normal.  Skin: Skin is warm and dry.  Psychiatric: She has a normal mood and affect.  Nursing note and vitals reviewed.   ED Course  Procedures (including critical care time) Labs Review Labs Reviewed - No data to display  Imaging Review Dg Cervical Spine Complete  11/16/2014   CLINICAL DATA:  Patient fell on steps.  Neck pain  EXAM: CERVICAL SPINE  4+ VIEWS  COMPARISON:  None.  FINDINGS: Frontal, lateral, open-mouth odontoid, and bilateral oblique views were obtained. There is no fracture or spondylolisthesis. Prevertebral soft tissues and predental space regions are normal. Disc spaces appear intact. There is no appreciable exit foraminal narrowing on the oblique views. There is reversal of lordotic curvature.  IMPRESSION: Reversal lordotic curvature. Suspect muscle spasm. If, however, there is concern for potential ligamentous injury, lateral flexion-extension views could be helpful to further evaluate. No fracture or spondylolisthesis. No appreciable arthropathy.   Electronically Signed   By: Bretta BangWilliam  Woodruff M.D.   On: 11/16/2014 09:53   Dg Lumbar Spine Complete  11/16/2014   CLINICAL DATA:  Acute right-sided lower back pain falling on steps at home. Initial encounter.  EXAM: LUMBAR SPINE - COMPLETE 4+ VIEW   COMPARISON:  None.  FINDINGS: There is no evidence of lumbar spine fracture. Alignment is normal. Posterior facet joints appear normal. Intervertebral disc spaces are maintained.  IMPRESSION: Normal lumbar spine.   Electronically Signed   By: Roque LiasJames  Green M.D.   On: 11/16/2014 09:54   Dg Forearm Left  11/16/2014   CLINICAL DATA:  Status post slipping and falling on her steps at home this morning with persistent forearm pain; history of 2 previous forearm fractures.  EXAM: LEFT FOREARM - 2 VIEW  COMPARISON:  None.  FINDINGS: The left radius and ulna are adequately mineralized. The proximal ulna is not well evaluated on the AP view however. There is no acute fracture nor dislocation. There are lucencies adjacent to 1 another in the mid shafts of both bones likely from sec from previous orthopedic fixation devices for known previous fractures. The observed portions of the wrist are unremarkable. This soft tissues the forearm exhibit no acute abnormalities.  IMPRESSION: No acute bony abnormality of the left radius or ulna is demonstrated. If there are symptoms referable to the elbow or wrist, focal x-ray examinations of these regions would be useful.   Electronically Signed  By: David  SwazilandJordan   On: 11/16/2014 09:55     EKG Interpretation None      MDM   Final diagnoses:  Fall  Cervical strain, acute, initial encounter  Lumbar strain, initial encounter    Pt reviewed on the [place] narcotic database.  No recent rx's filed  Pt is feeling better after medications, remains NV intact.  Ambulates with a steady gait.  No concerning sx's for emergent neurological or infectious process.  Likely musculoskeletal injuries.    Pt agrees to symptomatic tx with vicodin, ibuprofen and flexeril.  She also agrees to arrange f/u with her PMD or to return here if sx worsen.  She appears stable for d/c    Linda Piscitelli L. Trisha Mangleriplett, PA-C 11/17/14 1708  Linda MawKristen N Ward, DO 11/18/14 [phone]

## 2014-11-16 NOTE — Discharge Instructions (Signed)
Cervical Sprain °A cervical sprain is when the tissues (ligaments) that hold the neck bones in place stretch or tear. °HOME CARE  °· Put ice on the injured area. °· Put ice in a plastic bag. °· Place a towel between your skin and the bag. °· Leave the ice on for 15-20 minutes, 3-4 times a day. °· You may have been given a collar to wear. This collar keeps your neck from moving while you heal. °· Do not take the collar off unless told by your doctor. °· If you have long hair, keep it outside of the collar. °· Ask your doctor before changing the position of your collar. You may need to change its position over time to make it more comfortable. °· If you are allowed to take off the collar for cleaning or bathing, follow your doctor's instructions on how to do it safely. °· Keep your collar clean by wiping it with mild soap and water. Dry it completely. If the collar has removable pads, remove them every 1-2 days to hand wash them with soap and water. Allow them to air dry. They should be dry before you wear them in the collar. °· Do not drive while wearing the collar. °· Only take medicine as told by your doctor. °· Keep all doctor visits as told. °· Keep all physical therapy visits as told. °· Adjust your work station so that you have good posture while you work. °· Avoid positions and activities that make your problems worse. °· Warm up and stretch before being active. °GET HELP IF: °· Your pain is not controlled with medicine. °· You cannot take less pain medicine over time as planned. °· Your activity level does not improve as expected. °GET HELP RIGHT AWAY IF:  °· You are bleeding. °· Your stomach is upset. °· You have an allergic reaction to your medicine. °· You develop new problems that you cannot explain. °· You lose feeling (become numb) or you cannot move any part of your body (paralysis). °· You have tingling or weakness in any part of your body. °· Your symptoms get worse. Symptoms include: °· Pain,  soreness, stiffness, puffiness (swelling), or a burning feeling in your neck. °· Pain when your neck is touched. °· Shoulder or upper back pain. °· Limited ability to move your neck. °· Headache. °· Dizziness. °· Your hands or arms feel week, lose feeling, or tingle. °· Muscle spasms. °· Difficulty swallowing or chewing. °MAKE SURE YOU:  °· Understand these instructions. °· Will watch your condition. °· Will get help right away if you are not doing well or get worse. °Document Released: 04/23/2008 Document Revised: 07/08/2013 Document Reviewed: 05/13/2013 °[employer]® Patient Information ©2015 [employer]. This information is not intended to replace advice given to you by your health care provider. Make sure you discuss any questions you have with your health care provider. ° °Lumbosacral Strain °Lumbosacral strain is a strain of any of the parts that make up your lumbosacral vertebrae. Your lumbosacral vertebrae are the bones that make up the lower third of your backbone. Your lumbosacral vertebrae are held together by muscles and tough, fibrous tissue (ligaments).  °CAUSES  °A sudden blow to your back can cause lumbosacral strain. Also, anything that causes an excessive stretch of the muscles in the low back can cause this strain. This is typically seen when people exert themselves strenuously, fall, lift heavy objects, bend, or crouch repeatedly. °RISK FACTORS °· Physically demanding work. °· Participation in pushing   or pulling sports or sports that require a sudden twist of the back (tennis, golf, baseball). °· Weight lifting. °· Excessive lower back curvature. °· Forward-tilted pelvis. °· Weak back or abdominal muscles or both. °· Tight hamstrings. °SIGNS AND SYMPTOMS  °Lumbosacral strain may cause pain in the area of your injury or pain that moves (radiates) down your leg.  °DIAGNOSIS °Your health care provider can often diagnose lumbosacral strain through a physical exam. In some cases, you may need tests  such as X-ray exams.  °TREATMENT  °Treatment for your lower back injury depends on many factors that your clinician will have to evaluate. However, most treatment will include the use of anti-inflammatory medicines. °HOME CARE INSTRUCTIONS  °· Avoid hard physical activities (tennis, racquetball, waterskiing) if you are not in proper physical condition for it. This may aggravate or create problems. °· If you have a back problem, avoid sports requiring sudden body movements. Swimming and walking are generally safer activities. °· Maintain good posture. °· Maintain a healthy weight. °· For acute conditions, you may put ice on the injured area. °¨ Put ice in a plastic bag. °¨ Place a towel between your skin and the bag. °¨ Leave the ice on for 20 minutes, 2-3 times a day. °· When the low back starts healing, stretching and strengthening exercises may be recommended. °SEEK MEDICAL CARE IF: °· Your back pain is getting worse. °· You experience severe back pain not relieved with medicines. °SEEK IMMEDIATE MEDICAL CARE IF:  °· You have numbness, tingling, weakness, or problems with the use of your arms or legs. °· There is a change in bowel or bladder control. °· You have increasing pain in any area of the body, including your belly (abdomen). °· You notice shortness of breath, dizziness, or feel faint. °· You feel sick to your stomach (nauseous), are throwing up (vomiting), or become sweaty. °· You notice discoloration of your toes or legs, or your feet get very cold. °MAKE SURE YOU:  °· Understand these instructions. °· Will watch your condition. °· Will get help right away if you are not doing well or get worse. °Document Released: 08/15/2005 Document Revised: 11/10/2013 Document Reviewed: 06/24/2013 °[employer]® Patient Information ©2015 [employer]. This information is not intended to replace advice given to you by your health care provider. Make sure you discuss any questions you have with your health care  provider. ° °

## 2014-11-16 NOTE — ED Notes (Signed)
Pt took dog out this morning and fall on wood steps, hit right side of neck, left arm and lower back. Took tylenol without relief.

## 2014-12-15 ENCOUNTER — Encounter (HOSPITAL_COMMUNITY): Payer: Self-pay | Admitting: Cardiology

## 2014-12-15 ENCOUNTER — Emergency Department (HOSPITAL_COMMUNITY)
Admission: EM | Admit: 2014-12-15 | Discharge: 2014-12-15 | Disposition: A | Discharge status: Home / Self Care | Payer: Medicaid Other | Attending: Emergency Medicine | Admitting: Emergency Medicine

## 2014-12-15 DIAGNOSIS — K088 Other specified disorders of teeth and supporting structures: Secondary | ICD-10-CM | POA: Diagnosis not present

## 2014-12-15 DIAGNOSIS — R509 Fever, unspecified: Secondary | ICD-10-CM | POA: Insufficient documentation

## 2014-12-15 DIAGNOSIS — Z79899 Other long term (current) drug therapy: Secondary | ICD-10-CM | POA: Diagnosis not present

## 2014-12-15 DIAGNOSIS — K0889 Other specified disorders of teeth and supporting structures: Secondary | ICD-10-CM

## 2014-12-15 DIAGNOSIS — Z720 Tobacco use: Secondary | ICD-10-CM | POA: Diagnosis not present

## 2014-12-15 DIAGNOSIS — Z8719 Personal history of other diseases of the digestive system: Secondary | ICD-10-CM | POA: Diagnosis not present

## 2014-12-15 MED ORDER — IBUPROFEN 800 MG PO TABS
800.0000 mg | ORAL_TABLET | Freq: Once | ORAL | Status: AC
  Administered 2014-12-15: 800 mg via ORAL
  Filled 2014-12-15: qty 1

## 2014-12-15 MED ORDER — OXYCODONE-ACETAMINOPHEN 5-325 MG PO TABS
1.0000 | ORAL_TABLET | Freq: Once | ORAL | Status: AC
  Administered 2014-12-15: 1 via ORAL
  Filled 2014-12-15: qty 1

## 2014-12-15 MED ORDER — PENICILLIN V POTASSIUM 500 MG PO TABS: 500.0000 mg | ORAL_TABLET | Freq: Four times a day (QID) | ORAL | Status: AC

## 2014-12-15 NOTE — ED Notes (Signed)
Dental pain times 3 days.

## 2014-12-15 NOTE — ED Provider Notes (Signed)
CSN: [id number]     Arrival date & time 12/15/14  1530 History   First MD Initiated Contact with Patient 12/15/14 1541     Chief Complaint  Patient presents with  . Dental Pain     Patient is a 24 y.o. female presenting with tooth pain. The history is provided by the patient.  Dental Pain Location:  Lower Severity:  Moderate Onset quality:  Gradual Duration:  3 days Timing:  Constant Progression:  Worsening Chronicity:  Recurrent Relieved by:  Nothing Worsened by:  Jaw movement and pressure Associated symptoms: fever     Past Medical History  Diagnosis Date  . Constipation     around age 89  . History of stomach ulcers    Past Surgical History  Procedure Laterality Date  . Left arm    . Fracture surgery     History reviewed. No pertinent family history. History  Substance Use Topics  . Smoking status: Current Every Day Smoker -- 0.50 packs/day  . Smokeless tobacco: Not on file  . Alcohol Use: No   OB History    No data available     Review of Systems  Constitutional: Positive for fever.  Gastrointestinal: Negative for vomiting.      Allergies  Review of patient's allergies indicates no known allergies.  Home Medications   Prior to Admission medications   Medication Sig Start Date End Date Taking? Authorizing Provider  citalopram (CELEXA) 20 MG tablet Take 20 mg by mouth at bedtime.      Historical Provider, MD  levonorgestrel-ethinyl estradiol (AVIANE,ALESSE,LESSINA) 0.1-20 MG-MCG tablet Take 1 tablet by mouth daily.    Historical Provider, MD  penicillin v potassium (VEETID) 500 MG tablet Take 1 tablet (500 mg total) by mouth 4 (four) times daily. 12/15/14 12/22/14  Joya Gaskinsonald W Avaneesh Pepitone, MD   BP 128/78 mmHg  Pulse 93  Temp(Src) 98.1 F (36.7 C) (Oral)  Resp 18  Ht 5\' 11"  (1.803 m)  Wt 205 lb (92.987 kg)  BMI 28.60 kg/m2  SpO2 100%  LMP 12/11/2014 Physical Exam CONSTITUTIONAL: Well developed/well nourished HEAD AND FACE:  Normocephalic/atraumatic EYES: EOMI/PERRL ENMT: Mucous membranes moist.  Poor dentition.  No trismus.  No focal abscess noted. NECK: supple no meningeal signs CV: S1/S2 noted, no murmurs/rubs/gallops noted LUNGS: Lungs are clear to auscultation bilaterally, no apparent distress ABDOMEN: soft, nontender, no rebound or guarding NEURO: Pt is awake/alert, moves all extremitiesx4 EXTREMITIES:full ROM SKIN: warm, color normal  ED Course  Procedures    Medications  oxyCODONE-acetaminophen (PERCOCET/ROXICET) 5-325 MG per tablet 1 tablet (1 tablet Oral Given 12/15/14 1552)  ibuprofen (ADVIL,MOTRIN) tablet 800 mg (800 mg Oral Given 12/15/14 1552)     MDM   Final diagnoses:  Pain, dental    Nursing notes including past medical history and social history reviewed and considered in documentation Previous records reviewed and considered     Joya Gaskinsonald W Ryeleigh Santore, MD 12/15/14 1553

## 2014-12-15 NOTE — Discharge Instructions (Signed)

## 2015-09-01 ENCOUNTER — Encounter: Payer: Self-pay | Admitting: Emergency Medicine

## 2015-09-01 ENCOUNTER — Emergency Department
Admission: EM | Admit: 2015-09-01 | Discharge: 2015-09-01 | Disposition: A | Discharge status: Home / Self Care | Payer: Medicaid Other | Attending: Emergency Medicine | Admitting: Emergency Medicine

## 2015-09-01 DIAGNOSIS — R1912 Hyperactive bowel sounds: Secondary | ICD-10-CM | POA: Insufficient documentation

## 2015-09-01 DIAGNOSIS — F419 Anxiety disorder, unspecified: Secondary | ICD-10-CM | POA: Diagnosis not present

## 2015-09-01 DIAGNOSIS — F1110 Opioid abuse, uncomplicated: Secondary | ICD-10-CM | POA: Insufficient documentation

## 2015-09-01 DIAGNOSIS — Z720 Tobacco use: Secondary | ICD-10-CM | POA: Insufficient documentation

## 2015-09-01 DIAGNOSIS — R000 Tachycardia, unspecified: Secondary | ICD-10-CM | POA: Insufficient documentation

## 2015-09-01 DIAGNOSIS — F1010 Alcohol abuse, uncomplicated: Secondary | ICD-10-CM | POA: Diagnosis present

## 2015-09-01 DIAGNOSIS — F1910 Other psychoactive substance abuse, uncomplicated: Secondary | ICD-10-CM | POA: Diagnosis not present

## 2015-09-01 DIAGNOSIS — F329 Major depressive disorder, single episode, unspecified: Secondary | ICD-10-CM | POA: Insufficient documentation

## 2015-09-01 DIAGNOSIS — Z79899 Other long term (current) drug therapy: Secondary | ICD-10-CM | POA: Insufficient documentation

## 2015-09-01 MED ORDER — NICOTINE 10 MG IN INHA
1.0000 | RESPIRATORY_TRACT | Status: DC | PRN
  Administered 2015-09-01: 1 via RESPIRATORY_TRACT
  Filled 2015-09-01: qty 36

## 2015-09-01 MED ORDER — ONDANSETRON 8 MG PO TBDP
8.0000 mg | ORAL_TABLET | Freq: Once | ORAL | Status: AC
  Administered 2015-09-01: 8 mg via ORAL
  Filled 2015-09-01: qty 1

## 2015-09-01 MED ORDER — CLONIDINE HCL 0.1 MG PO TABS
0.1000 mg | ORAL_TABLET | Freq: Once | ORAL | Status: AC
  Administered 2015-09-01: 0.1 mg via ORAL
  Filled 2015-09-01: qty 1

## 2015-09-01 NOTE — ED Provider Notes (Signed)
[facility] Emergency Department Provider Note  ____________________________________________  Time seen: 7:20 PM  I have reviewed the triage vital signs and the nursing notes.   HISTORY  Chief Complaint Drug Problem    HPI Linda Dyer is a 24 y.o. female who presents for detox evaluation. She states that chronically she has taken opioid medications Antabuse then. More recently over the last 2 weeks she has been smoking crack daily and using Xanax 1-2 mg daily. Her last use of both was earlier today. She denies any tremor sweating chest pain shortness breath fevers or chills. She does feel anxious as usual.     Past Medical History  Diagnosis Date  . Constipation     around age 63  . History of stomach ulcers    depression   There are no active problems to display for this patient.    Past Surgical History  Procedure Laterality Date  . Left arm    . Fracture surgery       Current Outpatient Rx  Name  Route  Sig  Dispense  Refill  . citalopram (CELEXA) 20 MG tablet   Oral   Take 20 mg by mouth at bedtime.           Marland Kitchen levonorgestrel-ethinyl estradiol (AVIANE,ALESSE,LESSINA) 0.1-20 MG-MCG tablet   Oral   Take 1 tablet by mouth daily.            Allergies Review of patient's allergies indicates no known allergies.   No family history on file.  Social History Social History  Substance Use Topics  . Smoking status: Current Every Day Smoker -- 1.00 packs/day    Types: Cigarettes  . Smokeless tobacco: None  . Alcohol Use: No   polysubstance abuse  Review of Systems  Constitutional:   No fever or chills. No weight changes Eyes:   No blurry vision or double vision.  ENT:   No sore throat. Cardiovascular:   No chest pain. Respiratory:   No dyspnea or cough. Gastrointestinal:   Positive generalized abdominal pain with nausea vomiting and diarrhea today.  No BRBPR or melena. Genitourinary:   Negative for dysuria, urinary  retention, bloody urine, or difficulty urinating. Musculoskeletal:   Negative for back pain. No joint swelling or pain. Skin:   Negative for rash. Neurological:   Negative for headaches, focal weakness or numbness. Psychiatric:  No anxiety or depression.   Endocrine:  No hot/cold intolerance, changes in energy, or sleep difficulty.  10-point ROS otherwise negative.  ____________________________________________   PHYSICAL EXAM:  VITAL SIGNS: ED Triage Vitals  Enc Vitals Group     BP 09/01/15 1754 135/86 mmHg     Pulse Rate 09/01/15 1754 119     Resp 09/01/15 1754 18     Temp 09/01/15 1754 98.2 F (36.8 C)     Temp Source 09/01/15 1754 Oral     SpO2 09/01/15 1754 98 %     Weight 09/01/15 1754 149 lb 14.4 oz (67.994 kg)     Height 09/01/15 1754  (1.803 m)     Head Cir --      Peak Flow --      Pain Score --      Pain Loc --      Pain Edu? --      Excl. in GC? --      Constitutional:   Alert and oriented. Well appearing and in no distress. Mildly Anxious appearing Eyes:   No scleral icterus. No  conjunctival pallor. PERRL. EOMI ENT   Head:   Normocephalic and atraumatic.   Nose:   No congestion/rhinnorhea. No septal hematoma   Mouth/Throat:   MMM, no pharyngeal erythema. No peritonsillar mass. No uvula shift.   Neck:   No stridor. No SubQ emphysema. No meningismus. Hematological/Lymphatic/Immunilogical:   No cervical lymphadenopathy. Cardiovascular:   RRR heart rate 90. Normal and symmetric distal pulses are present in all extremities. No murmurs, rubs, or gallops. Respiratory:   Normal respiratory effort without tachypnea nor retractions. Breath sounds are clear and equal bilaterally. No wheezes/rales/rhonchi. Gastrointestinal:   Hyperactive bowel sounds. Soft and nontender. No distention. There is no CVA tenderness.  No rebound, rigidity, or guarding. Genitourinary:   deferred Musculoskeletal:   Nontender with normal range of motion in all extremities.  No joint effusions.  No lower extremity tenderness.  No edema. Neurologic:   Normal speech and language.  CN 2-10 normal. Motor grossly intact. No pronator drift.  Normal gait. No gross focal neurologic deficits are appreciated.  Skin:    Skin is warm, dry and intact. No rash noted.  No petechiae, purpura, or bullae. No piloerection Psychiatric:  Depressed affect. Speech and behavior are normal. Patient exhibits appropriate insight and judgment. No suicidal ideation and homicidal ideation or hallucinations  ____________________________________________    LABS (pertinent positives/negatives) (all labs ordered are listed, but only abnormal results are displayed) Labs Reviewed - No data to display ____________________________________________   EKG    ____________________________________________    RADIOLOGY    ____________________________________________   PROCEDURES   ____________________________________________   INITIAL IMPRESSION / ASSESSMENT AND PLAN / ED COURSE  Pertinent labs & imaging results that were available during my care of the patient were reviewed by me and considered in my medical decision making (see chart for details).  Patient medically clear. We'll give Zofran and clonidine as needed for symptom relief. We will obtain a behavioral medicine intake evaluation for consideration of resources. Likely she will need to follow-up with outpatient community programs. No evidence of withdrawal, at low risk for benzo withdrawal and at low doses that she is taking daily.     ____________________________________________   FINAL CLINICAL IMPRESSION(S) / ED DIAGNOSES  Final diagnoses:  Polysubstance abuse  Anxiety      Sharman CheekPhillip Tangi Shroff, MD 09/01/15 2006

## 2015-09-01 NOTE — ED Provider Notes (Signed)
-----------------------------------------   11:33 PM on 09/01/2015 -----------------------------------------  Patient is voluntary and wants to go home.  Will d/c w/ recommendations for [facility].  Loleta Roseory Demontae Antunes, MD 09/01/15 [phone]

## 2015-09-01 NOTE — ED Notes (Signed)
Pt here for detox from cocaine, xanax. States she last used today. Denies pain, states her heart is beating fast.

## 2015-09-01 NOTE — Discharge Instructions (Signed)
You have been seen in the Emergency Department (ED) today for substance abuse.  You have been evaluated by the behavioral medicine specialists and are being [facility]  Please return to the ED immediately if you have ANY thoughts of hurting yourself or anyone else, so that we may help you.  Please avoid alcohol and drug use.  Follow up with your doctor and/or therapist as soon as possible regarding today's ED  visit.   Please follow up any other recommendations and clinic appointments provided by the psychiatry team that saw you in the Emergency Department.   Polysubstance Abuse When people abuse more than one drug or type of drug it is called polysubstance or polydrug abuse. For example, many smokers also drink alcohol. This is one form of polydrug abuse. Polydrug abuse also refers to the use of a drug to counteract an unpleasant effect produced by another drug. It may also be used to help with withdrawal from another drug. People who take stimulants may become agitated. Sometimes this agitation is countered with a tranquilizer. This helps protect against the unpleasant side effects. Polydrug abuse also refers to the use of different drugs at the same time.  Anytime drug use is interfering with normal living activities, it has become abuse. This includes problems with family and friends. Psychological dependence has developed when your mind tells you that the drug is needed. This is usually followed by physical dependence which has developed when continuing increases of drug are required to get the same feeling or "high". This is known as addiction or chemical dependency. A person's risk is much higher if there is a history of chemical dependency in the family. SIGNS OF CHEMICAL DEPENDENCY  You have been told by friends or family that drugs have become a problem.  You fight when using drugs.  You are having blackouts (not remembering what you do while  using).  You feel sick from using drugs but continue using.  You lie about use or amounts of drugs (chemicals) used.  You need chemicals to get you going.  You are suffering in work performance or in school because of drug use.  You get sick from use of drugs but continue to use anyway.  You need drugs to relate to people or feel comfortable in social situations.  You use drugs to forget problems. "Yes" answered to any of the above signs of chemical dependency indicates there are problems. The longer the use of drugs continues, the greater the problems will become. If there is a family history of drug or alcohol use, it is best not to experiment with these drugs. Continual use leads to tolerance. After tolerance develops more of the drug is needed to get the same feeling. This is followed by addiction. With addiction, drugs become the most important part of life. It becomes more important to take drugs than participate in the other usual activities of life. This includes relating to friends and family. Addiction is followed by dependency. Dependency is a condition where drugs are now needed not just to get high, but to feel normal. Addiction cannot be cured but it can be stopped. This often requires outside help and the care of professionals. Treatment centers are listed in the yellow pages under: Cocaine, Narcotics, and Alcoholics Anonymous. Most hospitals and clinics can refer you to a specialized care center. Talk to your caregiver if you need help.   This information is not intended to replace advice given to you  by your health care provider. Make sure you discuss any questions you have with your health care provider.   Document Released: 06/27/2005 Document Revised: 01/28/2012 Document Reviewed: 11/10/2014 Elsevier Interactive Patient Education [employer]2016 [employer].

## 2015-09-01 NOTE — ED Notes (Signed)
BEHAVIORAL HEALTH ROUNDING Patient sleeping: No. Patient alert and oriented: yes Behavior appropriate: Yes.  ; If no, describe:  Nutrition and fluids offered: Yes  Toileting and hygiene offered: Yes  Sitter present: yes Law enforcement present: Yes  

## 2015-09-01 NOTE — [facility] Counselor (Signed)
Pt presents to ED requesting detox from crack and Xanax.  Called [facility] to inquire about bed availability.  Staff reports that there would be a female bed available in the morning and that pt could report.  [facility] counselor faxed a copy of pt's demographics and vitals.

## 2015-09-01 NOTE — ED Notes (Signed)
Reports given to NikiskiNellie, Charity fundraiserN. Pt moved to [facility].

## 2015-09-01 NOTE — ED Notes (Signed)
Patient presents to ED wanting to "detox from crack cocaine, pain pills, and xanax." Reports last use was today, patient appears anxious and restless during assessment. (+) Nausea, vomiting, and diarrhea today,  Patient states "my body won't stop jerking" No obvious tremors noted. Patient alert and oriented x4, respirations even and unlabored, skin warm and dry.

## 2015-09-01 NOTE — ED Notes (Signed)

## 2015-09-01 NOTE — ED Notes (Signed)
Patient requesting something to eat and drink, MD made aware, Mal[place]urkey tray and cola provided to patient.

## 2015-11-12 IMAGING — CR DG LUMBAR SPINE COMPLETE 4+V
5 series · 5 of 5 positions shown · non-contrast
Comparison: None.

CLINICAL DATA: Acute right-sided lower back pain falling on steps
at home. Initial encounter.

EXAM:
LUMBAR SPINE - COMPLETE 4+ VIEW

[view not recorded (1 of 5)]
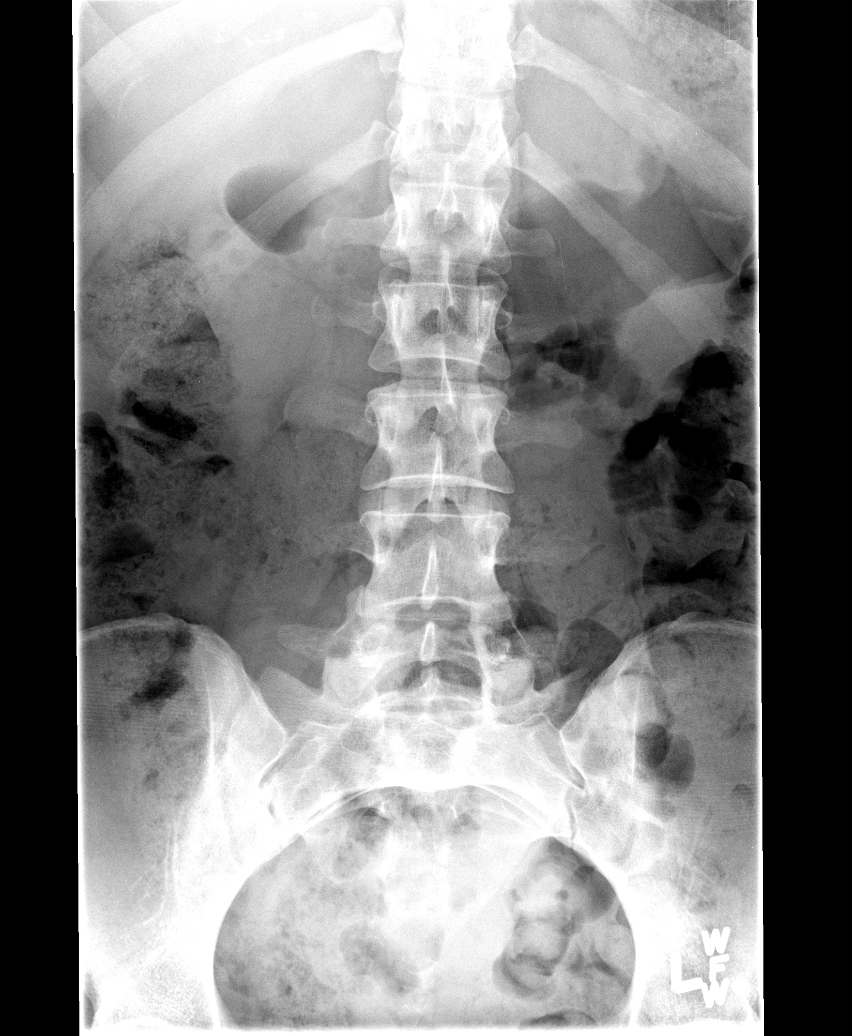

[view not recorded (2 of 5)]
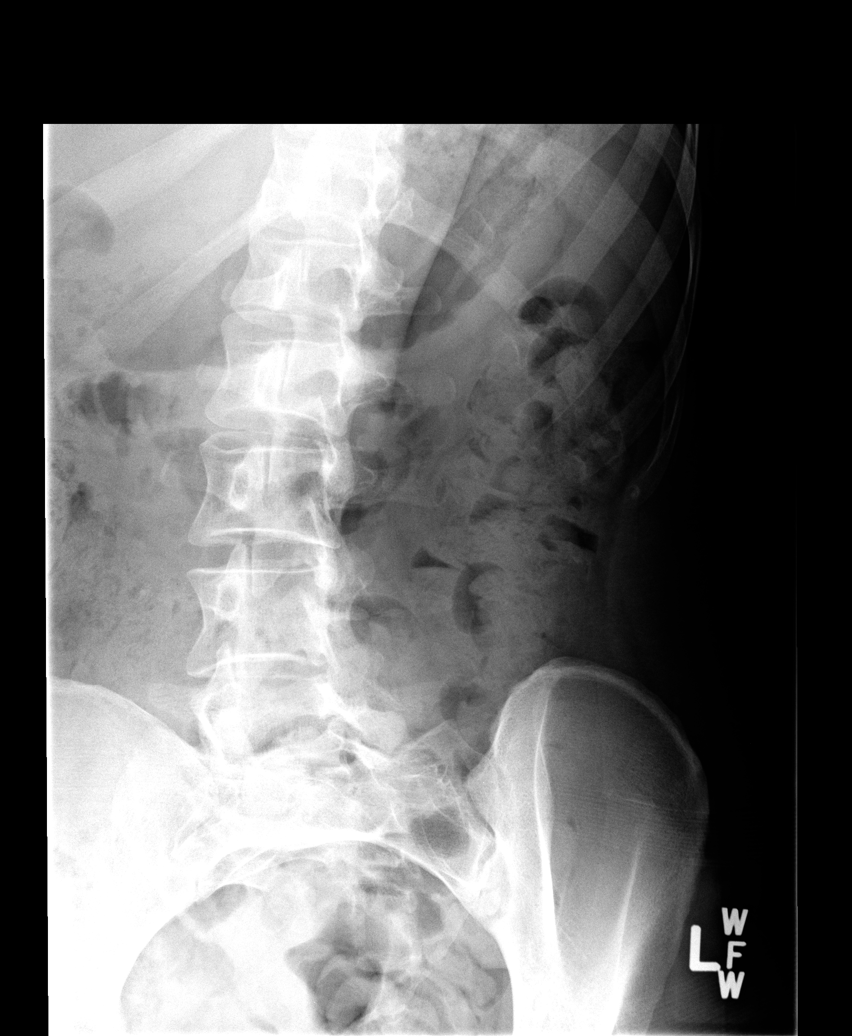

[view not recorded (3 of 5)]
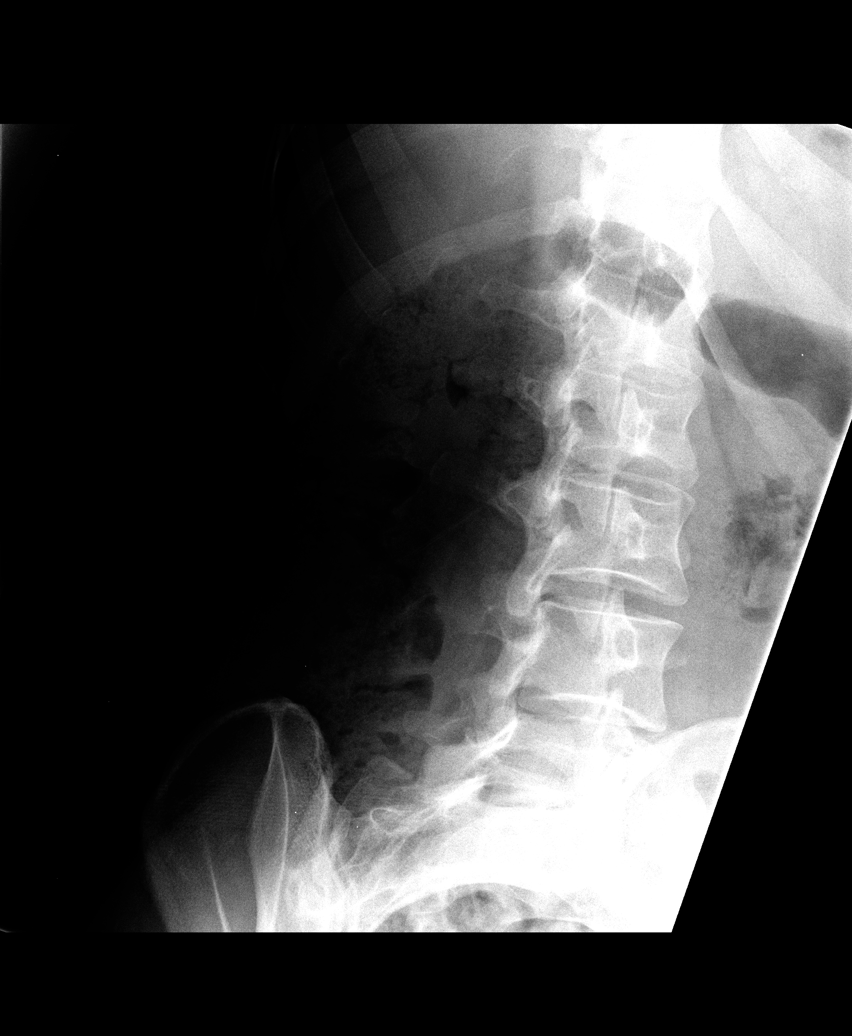

[view not recorded (4 of 5)]
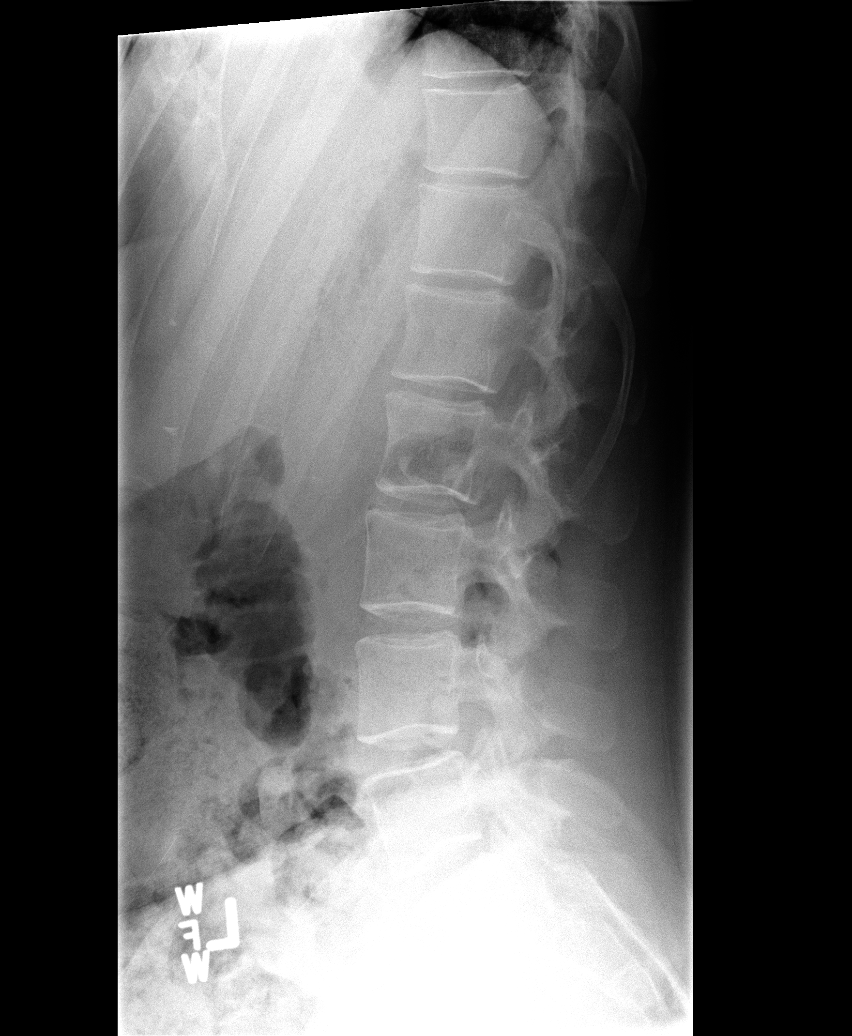

[view not recorded (5 of 5)]
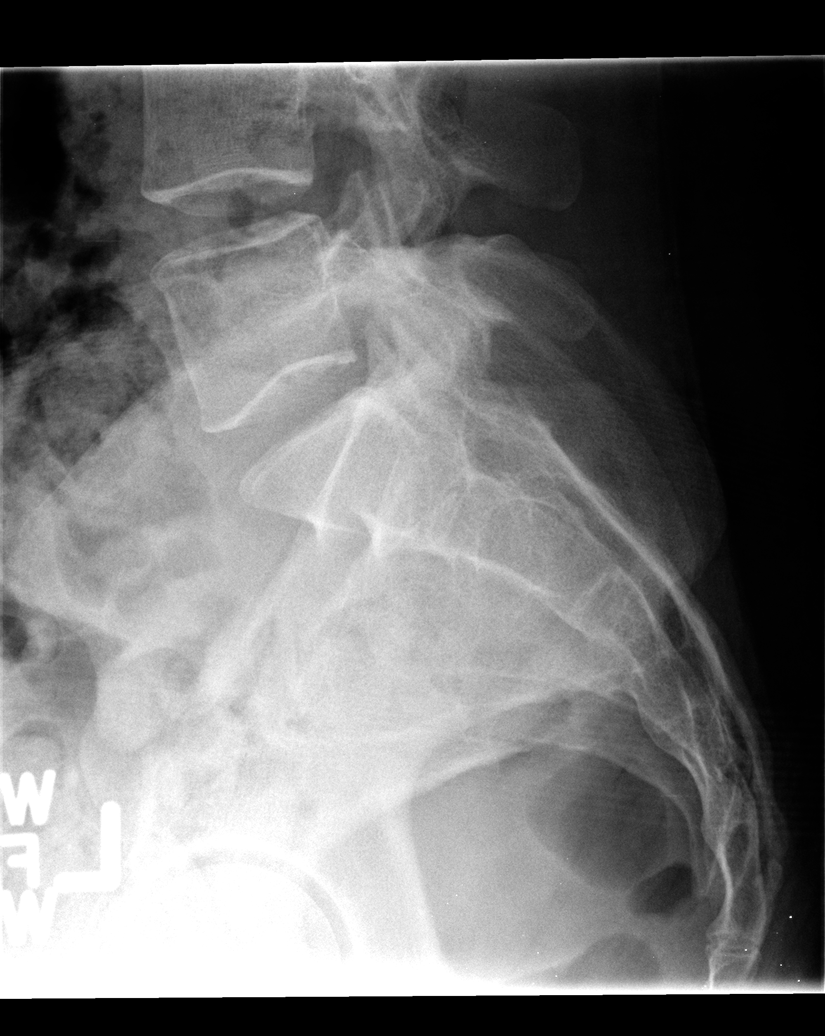

[5 of 5 positions shown; findings below may reference images not displayed]

FINDINGS: There is no evidence of lumbar spine fracture. Alignment is normal.
Posterior facet joints appear normal. Intervertebral disc spaces are
maintained.
IMPRESSION: Normal lumbar spine.

## 2016-04-20 ENCOUNTER — Emergency Department (HOSPITAL_COMMUNITY)
Admission: EM | Admit: 2016-04-20 | Discharge: 2016-04-20 | Disposition: A | Discharge status: Home / Self Care | Payer: Medicaid Other | Attending: Emergency Medicine | Admitting: Emergency Medicine

## 2016-04-20 ENCOUNTER — Encounter (HOSPITAL_COMMUNITY): Payer: Self-pay | Admitting: Emergency Medicine

## 2016-04-20 DIAGNOSIS — F17210 Nicotine dependence, cigarettes, uncomplicated: Secondary | ICD-10-CM | POA: Insufficient documentation

## 2016-04-20 DIAGNOSIS — K0889 Other specified disorders of teeth and supporting structures: Secondary | ICD-10-CM

## 2016-04-20 DIAGNOSIS — F329 Major depressive disorder, single episode, unspecified: Secondary | ICD-10-CM | POA: Insufficient documentation

## 2016-04-20 DIAGNOSIS — R220 Localized swelling, mass and lump, head: Secondary | ICD-10-CM | POA: Insufficient documentation

## 2016-04-20 DIAGNOSIS — Z79899 Other long term (current) drug therapy: Secondary | ICD-10-CM | POA: Insufficient documentation

## 2016-04-20 DIAGNOSIS — K029 Dental caries, unspecified: Secondary | ICD-10-CM | POA: Insufficient documentation

## 2016-04-20 HISTORY — DX: Major depressive disorder, single episode, unspecified: F32.9

## 2016-04-20 HISTORY — DX: Depression, unspecified: F32.A

## 2016-04-20 HISTORY — DX: Other psychoactive substance abuse, uncomplicated: F19.10

## 2016-04-20 MED ORDER — OXYCODONE-ACETAMINOPHEN 5-325 MG PO TABS
2.0000 | ORAL_TABLET | Freq: Once | ORAL | Status: AC
  Administered 2016-04-20: 2 via ORAL
  Filled 2016-04-20: qty 2

## 2016-04-20 MED ORDER — NAPROXEN 500 MG PO TABS: 500.0000 mg | ORAL_TABLET | Freq: Two times a day (BID) | ORAL | Status: DC

## 2016-04-20 MED ORDER — PENICILLIN V POTASSIUM 500 MG PO TABS: 500.0000 mg | ORAL_TABLET | Freq: Four times a day (QID) | ORAL | Status: AC

## 2016-04-20 NOTE — ED Notes (Signed)
Pt comes in with left side lower jaw dental pain starting yesterday. Pt states her feet began swelling yesterday as well. Pt has no swelling noted in triage.

## 2016-04-20 NOTE — ED Provider Notes (Signed)
CSN: [id number]     Arrival date & time 04/20/16  1801 History   First MD Initiated Contact with Patient 04/20/16 1826     Chief Complaint  Patient presents with  . Dental Pain     (Consider location/radiation/quality/duration/timing/severity/associated sxs/prior Treatment) Patient is a 25 y.o. female presenting with tooth pain. The history is provided by the patient. No language interpreter was used.  Dental Pain Location:  Lower Lower teeth location:  18/LL 2nd molar Quality:  Pulsating, throbbing and constant Severity:  Severe Onset quality:  Gradual Duration:  2 days Timing:  Constant Progression:  Worsening Context: dental caries and dental fracture   Associated symptoms: facial swelling     Past Medical History  Diagnosis Date  . Constipation     around age 17  . History of stomach ulcers   . Polysubstance abuse   . Depression    Past Surgical History  Procedure Laterality Date  . Left arm    . Fracture surgery     No family history on file. Social History  Substance Use Topics  . Smoking status: Current Every Day Smoker -- 1.00 packs/day    Types: Cigarettes  . Smokeless tobacco: None  . Alcohol Use: No   OB History    No data available     Review of Systems  HENT: Positive for facial swelling.   All other systems reviewed and are negative.     Allergies  Review of patient's allergies indicates no known allergies.  Home Medications   Prior to Admission medications   Medication Sig Start Date End Date Taking? Authorizing Provider  acetaminophen (TYLENOL) 500 MG tablet Take 500 mg by mouth every 6 (six) hours as needed for mild pain or moderate pain.   Yes Historical Provider, MD  busPIRone (BUSPAR) 10 MG tablet Take 10 mg by mouth daily as needed (for anxiety).   Yes Historical Provider, MD  venlafaxine XR (EFFEXOR-XR) 150 MG 24 hr capsule Take 150 mg by mouth at bedtime.   Yes Historical Provider, MD   BP 127/87 mmHg  Pulse 107  Temp(Src) 98.3  F (36.8 C) (Oral)  Resp 17  Ht  (1.803 m)  Wt 76.204 kg  BMI 23.44 kg/m2  SpO2 95%  LMP 04/13/2016 Physical Exam  Constitutional: She is oriented to person, place, and time. She appears well-developed and well-nourished.  HENT:  Head: Normocephalic.  Mouth/Throat: No trismus in the jaw. Dental caries present.  Eyes: Conjunctivae are normal.  Neck: Neck supple.  Cardiovascular: Normal rate and regular rhythm.   Pulmonary/Chest: Effort normal and breath sounds normal.  Abdominal: Soft. Bowel sounds are normal.  Musculoskeletal: She exhibits no edema or tenderness.  Neurological: She is alert and oriented to person, place, and time.  Skin: Skin is warm and dry.  Psychiatric: She has a normal mood and affect.  Nursing note and vitals reviewed.   ED Course  Procedures (including critical care time) Labs Review Labs Reviewed - No data to display  Imaging Review No results found. I have personally reviewed and evaluated these images and lab results as part of my medical decision-making.   EKG Interpretation None      MDM   Final diagnoses:  None  Patient with dentalgia.  No abscess requiring immediate incision and drainage.  Exam not concerning for Ludwig's angina or pharyngeal abscess.  Will treat with penicillin and naprosyn. Pt instructed to follow-up with dentist.  Discussed return precautions. Pt safe for discharge.  Felicie Mornavid Patricia Perales, NP 04/20/16 1919  Rolland PorterMark James, MD 05/01/16 [phone]

## 2016-04-20 NOTE — Discharge Instructions (Signed)

## 2019-12-08 ENCOUNTER — Emergency Department (HOSPITAL_COMMUNITY): Payer: Self-pay

## 2019-12-08 ENCOUNTER — Encounter (HOSPITAL_COMMUNITY): Payer: Self-pay | Admitting: Emergency Medicine

## 2019-12-08 ENCOUNTER — Emergency Department (HOSPITAL_COMMUNITY)
Admission: EM | Admit: 2019-12-08 | Discharge: 2019-12-08 | Disposition: A | Discharge status: Home / Self Care | Payer: Self-pay | Attending: Emergency Medicine | Admitting: Emergency Medicine

## 2019-12-08 ENCOUNTER — Other Ambulatory Visit: Payer: Self-pay

## 2019-12-08 DIAGNOSIS — F17210 Nicotine dependence, cigarettes, uncomplicated: Secondary | ICD-10-CM | POA: Insufficient documentation

## 2019-12-08 DIAGNOSIS — J189 Pneumonia, unspecified organism: Secondary | ICD-10-CM | POA: Insufficient documentation

## 2019-12-08 DIAGNOSIS — Z20822 Contact with and (suspected) exposure to covid-19: Secondary | ICD-10-CM | POA: Insufficient documentation

## 2019-12-08 DIAGNOSIS — K047 Periapical abscess without sinus: Secondary | ICD-10-CM | POA: Insufficient documentation

## 2019-12-08 DIAGNOSIS — Z79899 Other long term (current) drug therapy: Secondary | ICD-10-CM | POA: Insufficient documentation

## 2019-12-08 LAB — POC SARS CORONAVIRUS 2 AG -  ED: SARS Coronavirus 2 Ag: NEGATIVE

## 2019-12-08 MED ORDER — IBUPROFEN 800 MG PO TABS: 800.0000 mg | ORAL_TABLET | Freq: Three times a day (TID) | ORAL | 0 refills | Status: DC

## 2019-12-08 MED ORDER — ALBUTEROL SULFATE HFA 108 (90 BASE) MCG/ACT IN AERS
4.0000 | INHALATION_SPRAY | Freq: Once | RESPIRATORY_TRACT | Status: AC
  Administered 2019-12-08: 4 via RESPIRATORY_TRACT
  Filled 2019-12-08: qty 6.7

## 2019-12-08 MED ORDER — AMOXICILLIN 500 MG PO CAPS: 500.0000 mg | ORAL_CAPSULE | Freq: Three times a day (TID) | ORAL | 0 refills | Status: DC

## 2019-12-08 NOTE — Discharge Instructions (Signed)
Alternate Tylenol and ibuprofen every 4 and 6 hours for body aches and/or fever.  Drink plenty of fluids.  Use your albuterol inhaler 1 to 2 puffs every 4-6 hours as needed.  Check your oxygen level daily, return to the emergency department if your oxygen level consistently stays below 90%.  Your Covid test is pending, please isolate at home until your results are back.  If your results are positive, please continue to isolate at home for at least 10 days.  Return to the emergency department for worsening symptoms such as persistent diarrhea or vomiting or shortness of breath.

## 2019-12-08 NOTE — ED Triage Notes (Signed)
Cough and dental abscess to LT lower side x 2-3 days

## 2019-12-08 NOTE — ED Provider Notes (Signed)
[facility] EMERGENCY DEPARTMENT Provider Note   CSN: [id number] Arrival date & time: 12/08/19  1721     History Chief Complaint  Patient presents with  . Cough    Linda Dyer is a 29 y.o. female.  HPI      Linda Dyer is a 29 y.o. female who presents to the Emergency Department complaining of wheezing, coughing, nasal congestion and left lower dental pain.  Symptoms have been present for 2 to 3 days.  She reports having a mostly nonproductive cough that is gradually increasing in intensity.  Yesterday, she noticed some tightness of her upper chest associated with her cough and audible wheezing.  She denies history of asthma but does admit to smoking cigarettes.  She also reports having pain and a area of swelling to her left lower gums for 2 days.  She has several decayed teeth on the left side and states that she felt an area of swelling along her gums that "popped" earlier today and has been draining a foul tasting fluid.  She also endorses some swelling along her left lower jaw.  She denies fever, chills, abdominal pain, loss of taste or smell, diarrhea, and chest pain.  No known Covid exposures.    Past Medical History:  Diagnosis Date  . Constipation    around age 11  . Depression   . History of stomach ulcers   . Polysubstance abuse ([place])     There are no problems to display for this patient.   Past Surgical History:  Procedure Laterality Date  . FRACTURE SURGERY    . left arm       OB History   No obstetric history on file.     No family history on file.  Social History   Tobacco Use  . Smoking status: Current Every Day Smoker    Packs/day: 1.00    Types: Cigarettes  . Smokeless tobacco: Never Used  Substance Use Topics  . Alcohol use: No  . Drug use: Not Currently    Types: Cocaine    Comment: painmeds, xanax    Home Medications Prior to Admission medications   Medication Sig Start Date End Date Taking? Authorizing Provider    acetaminophen (TYLENOL) 500 MG tablet Take 500 mg by mouth every 6 (six) hours as needed for mild pain or moderate pain.    [provider]  busPIRone (BUSPAR) 10 MG tablet Take 10 mg by mouth daily as needed (for anxiety).    [provider]  naproxen (NAPROSYN) 500 MG tablet Take 1 tablet (500 mg total) by mouth 2 (two) times daily. 04/20/16   Etta Quill, NP  venlafaxine XR (EFFEXOR-XR) 150 MG 24 hr capsule Take 150 mg by mouth at bedtime.    [provider]    Allergies    Patient has no known allergies.  Review of Systems   Review of Systems  Constitutional: Negative for appetite change, chills and fever.  HENT: Positive for congestion, dental problem, facial swelling and sore throat. Negative for trouble swallowing.   Eyes: Negative for pain and visual disturbance.  Respiratory: Positive for cough, chest tightness and wheezing. Negative for shortness of breath.   Cardiovascular: Negative for chest pain.  Gastrointestinal: Negative for abdominal pain, diarrhea, nausea and vomiting.  Genitourinary: Negative for dysuria.  Musculoskeletal: Negative for arthralgias, myalgias, neck pain and neck stiffness.  Skin: Negative for rash.  Neurological: Negative for dizziness, facial asymmetry, weakness, numbness and headaches.  Hematological: Negative for  adenopathy.    Physical Exam Updated Vital Signs BP 111/65 (BP Location: Right Arm)   Pulse (!) 102   Temp 98.3 F (36.8 C) (Oral)   Ht 6\' 1"  (1.854 m)   Wt 113.4 kg   LMP 11/29/2019   SpO2 97%   BMI 32.98 kg/m   Physical Exam Vitals and nursing note reviewed.  Constitutional:      General: She is not in acute distress.    Appearance: She is well-developed.  HENT:     Head: Normocephalic and atraumatic.     Jaw: No trismus.     Right Ear: Tympanic membrane and ear canal normal.     Left Ear: Tympanic membrane and ear canal normal.     Mouth/Throat:     Mouth: Mucous membranes are moist.      Dentition: Dental caries present. No dental abscesses.     Pharynx: Uvula midline. Posterior oropharyngeal erythema present. No oropharyngeal exudate or uvula swelling.     Comments: Mild erythema of the oropharynx.  Uvula is midline and nonedematous.  No tonsillar exudates noted.  No trismus.  Patient does have multiple dental caries on the left with tenderness to palpation along the lower first and second molars.  There is minimal edema noted of the surrounding gingiva with scant purulent drainage noted upon palpation.  No appreciable facial edema noted. Eyes:     Pupils: Pupils are equal, round, and reactive to light.  Neck:     Trachea: Phonation normal.  Cardiovascular:     Rate and Rhythm: Normal rate and regular rhythm.     Pulses: Normal pulses.  Pulmonary:     Effort: Pulmonary effort is normal. No respiratory distress.     Breath sounds: No stridor. Wheezing and rhonchi present. No rales.     Comments: Coarse lung sounds bilaterally with scattered rhonchi and few expiratory wheezes noted on the right base. Chest:     Chest wall: No tenderness.  Abdominal:     Palpations: Abdomen is soft. There is no mass.     Tenderness: There is no abdominal tenderness.  Musculoskeletal:        General: Normal range of motion.     Cervical back: Full passive range of motion without pain, normal range of motion and neck supple.  Lymphadenopathy:     Cervical: No cervical adenopathy.  Skin:    General: Skin is warm.     Capillary Refill: Capillary refill takes less than 2 seconds.     Findings: No erythema or rash.  Neurological:     Mental Status: She is alert and oriented to person, place, and time.     Sensory: No sensory deficit.     Motor: No weakness or abnormal muscle tone.     ED Results / Procedures / Treatments   Labs (all labs ordered are listed, but only abnormal results are displayed) Labs Reviewed  NOVEL CORONAVIRUS, NAA (HOSP ORDER, SEND-OUT TO REF LAB; TAT 18-24 HRS)    POC SARS CORONAVIRUS 2 AG -  ED    EKG None  Radiology DG Chest Portable 1 View  Result Date: 12/08/2019 CLINICAL DATA:  29 year old female with shortness of breath and cough EXAM: PORTABLE CHEST 1 VIEW COMPARISON:  Chest radiograph dated 09/26/2011 FINDINGS: Bilateral streaky hazy opacities primarily involving the left lung concerning for pneumonia. Clinical correlation and follow-up to resolution recommended. There is no pleural effusion or pneumothorax. The cardiac silhouette is within limits. No acute osseous pathology. IMPRESSION: Findings  most concerning for multifocal pneumonia. Clinical correlation and follow-up to resolution recommended. Electronically Signed   By: Elgie Collard M.D.   On: 12/08/2019 21:14    Procedures Procedures (including critical care time)  Medications Ordered in ED Medications  albuterol (VENTOLIN HFA) 108 (90 Base) MCG/ACT inhaler 4 puff (4 puffs Inhalation Given 12/08/19 2024)    ED Course  I have reviewed the triage vital signs and the nursing notes.  Pertinent labs & imaging results that were available during my care of the patient were reviewed by me and considered in my medical decision making (see chart for details).    MDM Rules/Calculators/A&P                     Patient well-appearing.  Nontoxic.  No hypoxia or fever.  Coarse lung sounds and scattered wheezing on my initial exam.  Will obtain chest x-ray and albuterol MDI.  Will reassess.   Pt reports feeling better after albuterol MDI.  Dispensed for home use.  Chest x-ray shows likely multifocal pneumonia.  High clinical suspicion for COVID-19.  Antigen testing is negative, PCR test is pending.  Patient agrees to home isolation until test results are back.  Afebrile with out hypoxia here.  Linda Dyer was evaluated in Emergency Department on 12/08/2019 for the symptoms described in the history of present illness. She was evaluated in the context of the global COVID-19 pandemic,  which necessitated consideration that the patient might be at risk for infection with the SARS-CoV-2 virus that causes COVID-19. Institutional protocols and algorithms that pertain to the evaluation of patients at risk for COVID-19 are in a state of rapid change based on information released by regulatory bodies including the CDC and federal and state organizations. These policies and algorithms were followed during the patient's care in the ED.  Final Clinical Impression(s) / ED Diagnoses Final diagnoses:  Multifocal pneumonia  Dental abscess    Rx / DC Orders ED Discharge Orders    None       Rosey Bath 12/08/19 2215    Raeford Razor, MD 12/09/19 1550

## 2019-12-10 LAB — NOVEL CORONAVIRUS, NAA (HOSP ORDER, SEND-OUT TO REF LAB; TAT 18-24 HRS): SARS-CoV-2, NAA: NOT DETECTED

## 2019-12-30 ENCOUNTER — Emergency Department (HOSPITAL_COMMUNITY): Payer: Self-pay

## 2019-12-30 ENCOUNTER — Emergency Department (HOSPITAL_COMMUNITY)
Admission: EM | Admit: 2019-12-30 | Discharge: 2019-12-30 | Disposition: A | Discharge status: Home / Self Care | Payer: Self-pay | Attending: Emergency Medicine | Admitting: Emergency Medicine

## 2019-12-30 ENCOUNTER — Other Ambulatory Visit: Payer: Self-pay

## 2019-12-30 ENCOUNTER — Encounter (HOSPITAL_COMMUNITY): Payer: Self-pay | Admitting: Emergency Medicine

## 2019-12-30 DIAGNOSIS — Z8701 Personal history of pneumonia (recurrent): Secondary | ICD-10-CM | POA: Insufficient documentation

## 2019-12-30 DIAGNOSIS — F17210 Nicotine dependence, cigarettes, uncomplicated: Secondary | ICD-10-CM | POA: Insufficient documentation

## 2019-12-30 DIAGNOSIS — R0789 Other chest pain: Secondary | ICD-10-CM | POA: Insufficient documentation

## 2019-12-30 DIAGNOSIS — Z79899 Other long term (current) drug therapy: Secondary | ICD-10-CM | POA: Insufficient documentation

## 2019-12-30 MED ORDER — IBUPROFEN 800 MG PO TABS: 800.0000 mg | ORAL_TABLET | Freq: Three times a day (TID) | ORAL | 0 refills | Status: DC | PRN

## 2019-12-30 NOTE — Discharge Instructions (Addendum)
Your chest x-ray shows that your pneumonia has cleared.  I feel that this is pleuritic pain associated with the condition and the coughing.  Follow-up with your primary doctor.  Return here as needed for any worsening in your condition.

## 2019-12-30 NOTE — ED Provider Notes (Signed)
[facility] EMERGENCY DEPARTMENT Provider Note   CSN: [id number] Arrival date & time: 12/30/19  1341     History Chief Complaint  Patient presents with  . Chest Pain    Post pneumonia 2 weeks ago    Linda Dyer is a 29 y.o. female.  HPI Patient presents to the emergency department with right sided lateral chest pain that radiates to the back.  The patient states that she still coughing a fair amount but her symptoms have improved.  Patient states the symptoms started 3 days ago.  Patient was diagnosed with pneumonia on January 19.  Patient states she took all of her antibiotics and has improved.  The patient denies , shortness of breath, headache,blurred vision, neck pain, fever, cough, weakness, numbness, dizziness, anorexia, edema, abdominal pain, nausea, vomiting, diarrhea, rash, back pain, dysuria, hematemesis, bloody stool, near syncope, or syncope.    Past Medical History:  Diagnosis Date  . Constipation    around age 36  . Depression   . History of stomach ulcers   . Polysubstance abuse ([place])     There are no problems to display for this patient.   Past Surgical History:  Procedure Laterality Date  . FRACTURE SURGERY    . left arm       OB History    Gravida  1   Para  1   Term  1   Preterm      AB      Living        SAB      TAB      Ectopic      Multiple      Live Births              Family History  Problem Relation Age of Onset  . Diabetes Father     Social History   Tobacco Use  . Smoking status: Current Every Day Smoker    Packs/day: 1.00    Types: Cigarettes  . Smokeless tobacco: Never Used  Substance Use Topics  . Alcohol use: No  . Drug use: Not Currently    Types: Cocaine    Comment: painmeds, xanax    Home Medications Prior to Admission medications   Medication Sig Start Date End Date Taking? Authorizing Provider  acetaminophen (TYLENOL) 500 MG tablet Take 500 mg by mouth every 6 (six) hours as needed for  mild pain or moderate pain.    [provider]  amoxicillin (AMOXIL) 500 MG capsule Take 1 capsule (500 mg total) by mouth 3 (three) times daily. 12/08/19   Triplett, Tammy, PA-C  busPIRone (BUSPAR) 10 MG tablet Take 10 mg by mouth daily as needed (for anxiety).    [provider]  ibuprofen (ADVIL) 800 MG tablet Take 1 tablet (800 mg total) by mouth 3 (three) times daily. 12/08/19   Triplett, Tammy, PA-C  naproxen (NAPROSYN) 500 MG tablet Take 1 tablet (500 mg total) by mouth 2 (two) times daily. 04/20/16   Etta Quill, NP  venlafaxine XR (EFFEXOR-XR) 150 MG 24 hr capsule Take 150 mg by mouth at bedtime.    [provider]    Allergies    Patient has no known allergies.  Review of Systems   Review of Systems All other systems negative except as documented in the HPI. All pertinent positives and negatives as reviewed in the HPI. Physical Exam Updated Vital Signs BP 120/66 (BP Location: Right Arm)   Pulse 75   Temp 98.2  F (36.8 C) (Oral)   Resp 18   Ht 6\' 1"  (1.854 m)   Wt 113.4 kg   LMP 12/25/2019   BMI 32.98 kg/m   Physical Exam Vitals and nursing note reviewed.  Constitutional:      General: She is not in acute distress.    Appearance: She is well-developed.  HENT:     Head: Normocephalic and atraumatic.  Eyes:     Pupils: Pupils are equal, round, and reactive to light.  Cardiovascular:     Rate and Rhythm: Normal rate and regular rhythm.     Heart sounds: Normal heart sounds. No murmur. No friction rub. No gallop.   Pulmonary:     Effort: Pulmonary effort is normal. No respiratory distress.     Breath sounds: Normal breath sounds. No decreased breath sounds, wheezing, rhonchi or rales.  Abdominal:     General: Bowel sounds are normal. There is no distension.     Palpations: Abdomen is soft.     Tenderness: There is no abdominal tenderness.  Musculoskeletal:     Cervical back: Normal range of motion and neck supple.  Skin:    General: Skin  is warm and dry.     Capillary Refill: Capillary refill takes less than 2 seconds.     Findings: No erythema or rash.  Neurological:     Mental Status: She is alert and oriented to person, place, and time.     Motor: No abnormal muscle tone.     Coordination: Coordination normal.  Psychiatric:        Behavior: Behavior normal.     ED Results / Procedures / Treatments   Labs (all labs ordered are listed, but only abnormal results are displayed) Labs Reviewed - No data to display  EKG None  Radiology DG Chest 2 View  Result Date: 12/30/2019 CLINICAL DATA:  Chest pain.  Recent pneumonia EXAM: CHEST - 2 VIEW COMPARISON:  December 10, 2019 FINDINGS: There is been interval clearing of airspace opacity bilaterally since prior study. Currently lungs are clear. Heart size and pulmonary vascularity are normal. No adenopathy. No pneumothorax. No bone lesions. IMPRESSION: Lungs now clear. Cardiac silhouette within normal limits. No adenopathy. Electronically Signed   By: December 12, 2019 III M.D.   On: 12/30/2019 14:54    Procedures Procedures (including critical care time)  Medications Ordered in ED Medications - No data to display  ED Course  I have reviewed the triage vital signs and the nursing notes.  Pertinent labs & imaging results that were available during my care of the patient were reviewed by me and considered in my medical decision making (see chart for details).    MDM Rules/Calculators/A&P                      Patient be treated for the pleuritic pain based on her HPI and physical exam findings.  Patient still coughing and that is why I feel that she is having this chest wall pain.  The patient is PERC negative. Final Clinical Impression(s) / ED Diagnoses Final diagnoses:  None    Rx / DC Orders ED Discharge Orders    None       02/27/2020, PA-C 12/30/19 1525    02/27/20, MD 12/31/19 [phone]

## 2019-12-30 NOTE — ED Triage Notes (Signed)
Patient c/o R sided chest and rib pain, states she was hospitalized for pneumonia 2 weeks ago. Patient states pain started about 3 days ago.

## 2020-12-25 IMAGING — DX DG CHEST 2V
2 series · 2 of 2 positions shown · non-contrast
Comparison: December 10, 2019

CLINICAL DATA: Chest pain.  Recent pneumonia

EXAM:
CHEST - 2 VIEW

[chest pa]
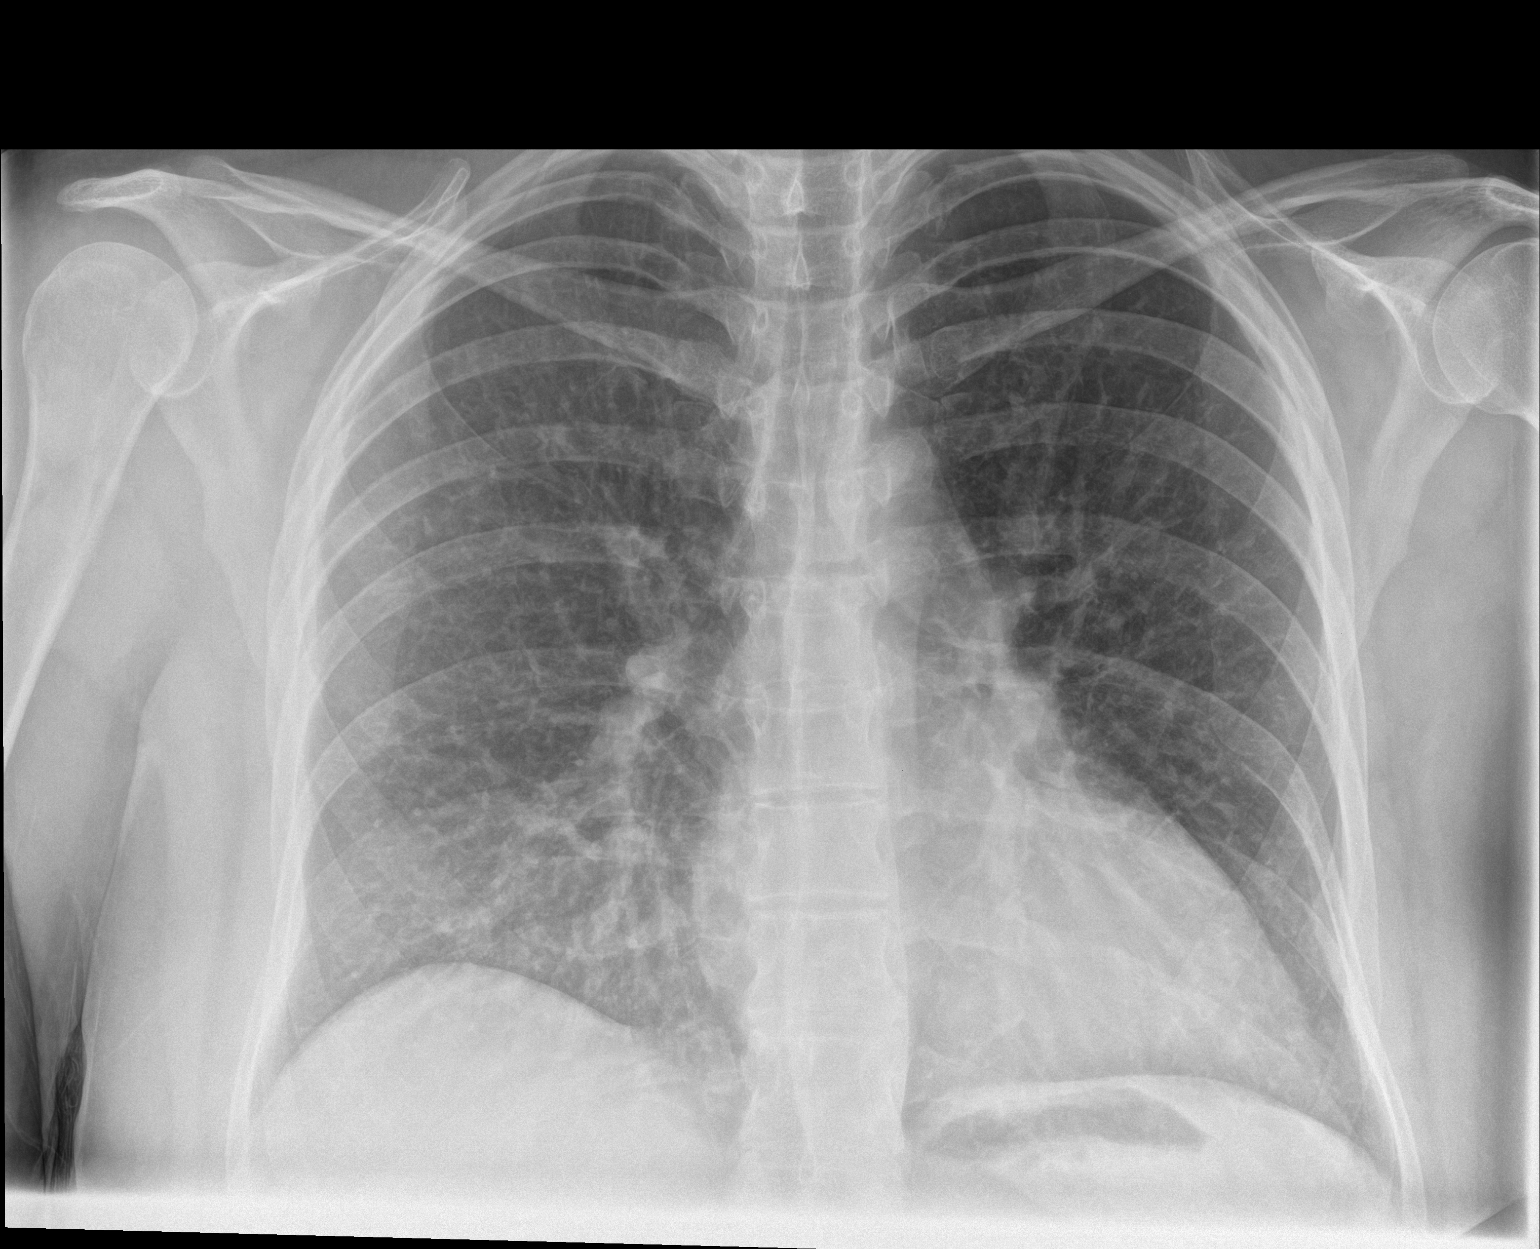

[chest lat]
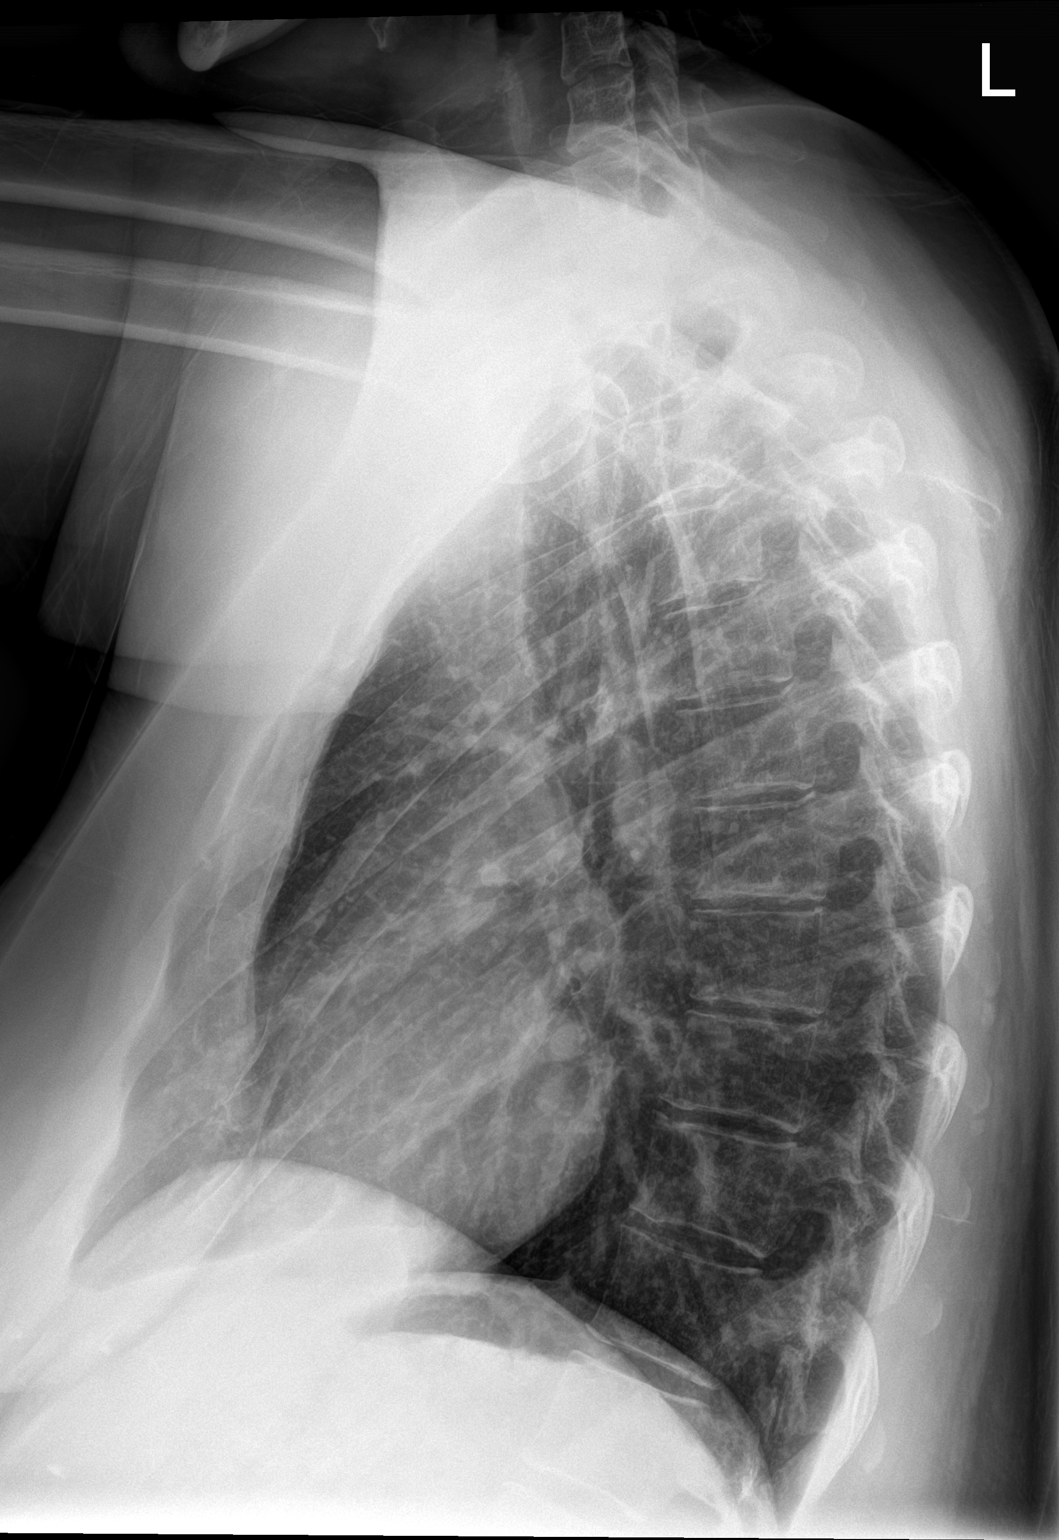

[2 of 2 positions shown; findings below may reference images not displayed]

FINDINGS: There is been interval clearing of airspace opacity bilaterally
since prior study. Currently lungs are clear. Heart size and
pulmonary vascularity are normal. No adenopathy. No pneumothorax. No
bone lesions.
IMPRESSION: Lungs now clear. Cardiac silhouette within normal limits. No
adenopathy.

## 2021-11-28 DIAGNOSIS — F1120 Opioid dependence, uncomplicated: Secondary | ICD-10-CM | POA: Diagnosis not present

## 2021-12-15 DIAGNOSIS — U071 COVID-19: Secondary | ICD-10-CM | POA: Diagnosis not present

## 2021-12-15 DIAGNOSIS — Z20822 Contact with and (suspected) exposure to covid-19: Secondary | ICD-10-CM | POA: Diagnosis not present

## 2021-12-25 DIAGNOSIS — R053 Chronic cough: Secondary | ICD-10-CM | POA: Diagnosis not present

## 2021-12-25 DIAGNOSIS — U099 Post covid-19 condition, unspecified: Secondary | ICD-10-CM | POA: Diagnosis not present

## 2021-12-26 DIAGNOSIS — F1120 Opioid dependence, uncomplicated: Secondary | ICD-10-CM | POA: Diagnosis not present

## 2022-01-23 DIAGNOSIS — F1120 Opioid dependence, uncomplicated: Secondary | ICD-10-CM | POA: Diagnosis not present

## 2022-02-20 DIAGNOSIS — F1120 Opioid dependence, uncomplicated: Secondary | ICD-10-CM | POA: Diagnosis not present

## 2022-03-19 DIAGNOSIS — Z6834 Body mass index (BMI) 34.0-34.9, adult: Secondary | ICD-10-CM | POA: Diagnosis not present

## 2022-03-19 DIAGNOSIS — Z01419 Encounter for gynecological examination (general) (routine) without abnormal findings: Secondary | ICD-10-CM | POA: Diagnosis not present

## 2022-03-20 DIAGNOSIS — F1120 Opioid dependence, uncomplicated: Secondary | ICD-10-CM | POA: Diagnosis not present

## 2022-04-05 DIAGNOSIS — K047 Periapical abscess without sinus: Secondary | ICD-10-CM | POA: Diagnosis not present

## 2022-04-05 DIAGNOSIS — L304 Erythema intertrigo: Secondary | ICD-10-CM | POA: Diagnosis not present

## 2022-04-05 DIAGNOSIS — K0889 Other specified disorders of teeth and supporting structures: Secondary | ICD-10-CM | POA: Diagnosis not present

## 2022-04-09 DIAGNOSIS — N898 Other specified noninflammatory disorders of vagina: Secondary | ICD-10-CM | POA: Diagnosis not present

## 2022-04-09 DIAGNOSIS — L304 Erythema intertrigo: Secondary | ICD-10-CM | POA: Diagnosis not present

## 2022-04-24 DIAGNOSIS — F1120 Opioid dependence, uncomplicated: Secondary | ICD-10-CM | POA: Diagnosis not present

## 2022-05-29 DIAGNOSIS — F1120 Opioid dependence, uncomplicated: Secondary | ICD-10-CM | POA: Diagnosis not present

## 2022-06-06 DIAGNOSIS — M7989 Other specified soft tissue disorders: Secondary | ICD-10-CM | POA: Diagnosis not present

## 2022-06-06 DIAGNOSIS — F17210 Nicotine dependence, cigarettes, uncomplicated: Secondary | ICD-10-CM | POA: Diagnosis not present

## 2022-06-06 DIAGNOSIS — Z79899 Other long term (current) drug therapy: Secondary | ICD-10-CM | POA: Diagnosis not present

## 2022-06-06 DIAGNOSIS — R21 Rash and other nonspecific skin eruption: Secondary | ICD-10-CM | POA: Diagnosis not present

## 2022-06-06 DIAGNOSIS — M25572 Pain in left ankle and joints of left foot: Secondary | ICD-10-CM | POA: Diagnosis not present

## 2022-06-06 DIAGNOSIS — M79672 Pain in left foot: Secondary | ICD-10-CM | POA: Diagnosis not present

## 2022-06-19 DIAGNOSIS — F1120 Opioid dependence, uncomplicated: Secondary | ICD-10-CM | POA: Diagnosis not present

## 2022-08-07 DIAGNOSIS — F1120 Opioid dependence, uncomplicated: Secondary | ICD-10-CM | POA: Diagnosis not present

## 2022-10-02 DIAGNOSIS — F1120 Opioid dependence, uncomplicated: Secondary | ICD-10-CM | POA: Diagnosis not present

## 2022-10-30 DIAGNOSIS — F1120 Opioid dependence, uncomplicated: Secondary | ICD-10-CM | POA: Diagnosis not present

## 2022-12-11 DIAGNOSIS — F1120 Opioid dependence, uncomplicated: Secondary | ICD-10-CM | POA: Diagnosis not present

## 2022-12-26 DIAGNOSIS — M791 Myalgia, unspecified site: Secondary | ICD-10-CM | POA: Diagnosis not present

## 2022-12-26 DIAGNOSIS — Z20822 Contact with and (suspected) exposure to covid-19: Secondary | ICD-10-CM | POA: Diagnosis not present

## 2022-12-26 DIAGNOSIS — U071 COVID-19: Secondary | ICD-10-CM | POA: Diagnosis not present

## 2023-01-04 DIAGNOSIS — U071 COVID-19: Secondary | ICD-10-CM | POA: Diagnosis not present

## 2023-01-04 DIAGNOSIS — J209 Acute bronchitis, unspecified: Secondary | ICD-10-CM | POA: Diagnosis not present

## 2023-01-16 ENCOUNTER — Ambulatory Visit (INDEPENDENT_AMBULATORY_CARE_PROVIDER_SITE_OTHER): Payer: BC Managed Care – PPO | Admitting: Internal Medicine

## 2023-01-16 ENCOUNTER — Encounter: Payer: Self-pay | Admitting: Internal Medicine

## 2023-01-16 VITALS — BP 127/83 | HR 81 | Temp 98.1°F | Ht 73.0 in | Wt 253.9 lb

## 2023-01-16 DIAGNOSIS — K219 Gastro-esophageal reflux disease without esophagitis: Secondary | ICD-10-CM | POA: Diagnosis not present

## 2023-01-16 DIAGNOSIS — K5900 Constipation, unspecified: Secondary | ICD-10-CM | POA: Diagnosis not present

## 2023-01-16 DIAGNOSIS — B1920 Unspecified viral hepatitis C without hepatic coma: Secondary | ICD-10-CM | POA: Diagnosis not present

## 2023-01-16 DIAGNOSIS — R768 Other specified abnormal immunological findings in serum: Secondary | ICD-10-CM | POA: Diagnosis not present

## 2023-01-16 DIAGNOSIS — F1911 Other psychoactive substance abuse, in remission: Secondary | ICD-10-CM | POA: Diagnosis not present

## 2023-01-16 DIAGNOSIS — R1319 Other dysphagia: Secondary | ICD-10-CM

## 2023-01-16 MED ORDER — PANTOPRAZOLE SODIUM 40 MG PO TBEC: 40.0000 mg | DELAYED_RELEASE_TABLET | Freq: Every day | ORAL | 11 refills | Status: AC

## 2023-01-16 NOTE — Patient Instructions (Signed)
I am going to order blood work today at [facility] labs down the hall to check your chronic hepatitis C activity.  I am also going to screen you for hepatitis B and HIV.  We will call with results.  If you do have chronic hepatitis C, we will proceed with ultrasound of your liver.  After I have this back, we can start the process of getting you on treatment.  For your acid reflux and feeling of food getting stuck in your esophagus, I will start you on pantoprazole 40 mg daily.  I have sent this to your pharmacy.  This medication works best to be taken 30 minutes before breakfast.  We may need to proceed with upper endoscopy to further evaluate the we will hold off for now.  For your constipation, I want you to start taking over the counter MiraLAX 1 capful daily.  If this does not adequately control your constipation, I would increase to 2 capfuls daily.  If this is still not adequate, then I would add on once daily Dulcolax (bisacodyl) tablet.   Be sure to drink at least 6 glasses of water daily.   It was very nice meeting you today.  Dr. Abbey Chatters

## 2023-01-16 NOTE — Progress Notes (Signed)
Primary Care Physician:  Patient, No Pcp Per Primary Gastroenterologist:  Dr. Abbey Chatters  Chief Complaint  Patient presents with   Hepatitis C    Diagnosis with Hep C about 7 year ago. Would like to discuss treatment for Hep C.     HPI:   Linda Dyer is a 32 y.o. female who presents to the clinic today to discuss hepatitis C.  States she was diagnosed with hepatitis C 7 years ago.  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  Chronically takes Suboxone.  Also with intermittent acid reflux and occasional dysphagia.  Feels as though food will get stuck in her substernal region at times.  No melena hematochezia.  No epigastric or chest pain.  This typically leads to subsequent hiccups as well.  No previous upper endoscopy.  Also with chronic constipation.  Symptoms mild to moderate, chronic.  Worsening since being on Suboxone.  Takes Dulcolax as needed currently which helps.  Cannot have a bowel movement without taking it however.  Past Medical History:  Diagnosis Date   Constipation    around age 70   Depression    History of stomach ulcers    Polysubstance abuse ([place])     Past Surgical History:  Procedure Laterality Date   FRACTURE SURGERY     left arm      Current Outpatient Medications  Medication Sig Dispense Refill   buprenorphine-naloxone (SUBOXONE) 8-2 mg SUBL SL tablet Place 1 tablet under the tongue daily.     citalopram (CELEXA) 20 MG tablet Take 20 mg by mouth daily.     hydrOXYzine (ATARAX/VISTARIL) 25 MG tablet Take 25 mg by mouth 3 (three) times daily as needed for anxiety.     No current facility-administered medications for this visit.    Allergies as of 01/16/2023   (No Known Allergies)    Family History  Problem Relation Age of Onset   Diabetes Father     Social History   Socioeconomic History   Marital status: Married    Spouse name: Not on file   Number of children: Not on file   Years of education: Not on file   Highest  education level: Not on file  Occupational History   Not on file  Tobacco Use   Smoking status: Every Day    Packs/day: 1.00    Types: Cigarettes    Passive exposure: Current   Smokeless tobacco: Never  Vaping Use   Vaping Use: Never used  Substance and Sexual Activity   Alcohol use: No   Drug use: Not Currently    Types: Cocaine    Comment: painmeds, xanax   Sexual activity: Yes    Birth control/protection: None  Other Topics Concern   Not on file  Social History Narrative   Not on file   Social Determinants of Health   Financial Resource Strain: Not on file  Food Insecurity: Not on file  Transportation Needs: Not on file  Physical Activity: Not on file  Stress: Not on file  Social Connections: Not on file  Intimate Partner Violence: Not on file    Subjective: Review of Systems  Constitutional:  Negative for chills and fever.  HENT:  Negative for congestion and hearing loss.   Eyes:  Negative for blurred vision and double vision.  Respiratory:  Negative for cough and shortness of breath.   Cardiovascular:  Negative for chest pain and palpitations.  Gastrointestinal:  Positive for constipation.  Negative for abdominal pain, blood in stool, diarrhea, melena and vomiting.       Dysphagia  Genitourinary:  Negative for dysuria and urgency.  Musculoskeletal:  Negative for joint pain and myalgias.  Skin:  Negative for itching and rash.  Neurological:  Negative for dizziness and headaches.  Psychiatric/Behavioral:  Negative for depression. The patient is not nervous/anxious.        Objective: BP 127/83 (BP Location: Right Arm, Patient Position: Sitting, Cuff Size: Large)   Pulse 81   Temp 98.1 F (36.7 C) (Oral)   Ht '6\' 1"'$  (1.854 m)   Wt 253 lb 14.4 oz (115.2 kg)   BMI 33.50 kg/m  Physical Exam Constitutional:      Appearance: Normal appearance.  HENT:     Head: Normocephalic and atraumatic.  Eyes:     Extraocular Movements: Extraocular movements intact.      Conjunctiva/sclera: Conjunctivae normal.  Cardiovascular:     Rate and Rhythm: Normal rate and regular rhythm.  Pulmonary:     Effort: Pulmonary effort is normal.     Breath sounds: Normal breath sounds.  Abdominal:     General: Bowel sounds are normal.     Palpations: Abdomen is soft.  Musculoskeletal:        General: No swelling. Normal range of motion.     Cervical back: Normal range of motion and neck supple.  Skin:    General: Skin is warm and dry.     Coloration: Skin is not jaundiced.  Neurological:     General: No focal deficit present.     Mental Status: She is alert and oriented to person, place, and time.  Psychiatric:        Mood and Affect: Mood normal.        Behavior: Behavior normal.      Assessment/Plan:  Hepatitis C antibody positive-discussed chronic hepatitis C in depth with patient today.  Will check viral load and genotype today.  Screen for hepatitis B, hepatitis A immunity, HIV.  If she has evidence of chronic hepatitis C, will proceed with ultrasound with elastography.  Discussed treatment regimens with her today.  Acid reflux-intermittent esophageal dysphagia/hiccups-will start on pantoprazole 40 mg daily and see how she does.  If no response in 3 months, may need to consider upper endoscopy to further evaluate.  Constipation-mild, chronic, Suboxone likely contributing.  I recommended taking MiraLAX 1 capful daily.  If this is not adequate then increase to 2 capfuls daily.  If this is still not adequate then add on once daily Dulcolax.  Also recommended patient start taking fiber therapy.  Print out given to patient today.  Encouraged to drink at least 6 glasses of water daily.   Follow-up in 3 months.  01/16/2023 2:32 PM   Disclaimer: This note was dictated with voice recognition software. Similar sounding words can inadvertently be transcribed and may not be corrected upon review.

## 2023-01-22 LAB — HEPATIC FUNCTION PANEL
AG Ratio: 1.4 (calc) (ref 1.0–2.5)
ALT: 58 U/L — ABNORMAL HIGH (ref 6–29)
AST: 59 U/L — ABNORMAL HIGH (ref 10–30)
Albumin: 4.2 g/dL (ref 3.6–5.1)
Alkaline phosphatase (APISO): 68 U/L (ref 31–125)
Bilirubin, Direct: 0.2 mg/dL (ref 0.0–0.2)
Globulin: 2.9 g/dL (calc) (ref 1.9–3.7)
Indirect Bilirubin: 0.3 mg/dL (calc) (ref 0.2–1.2)
Total Bilirubin: 0.5 mg/dL (ref 0.2–1.2)
Total Protein: 7.1 g/dL (ref 6.1–8.1)

## 2023-01-22 LAB — HEPATITIS PANEL, ACUTE
Hep A IgM: NONREACTIVE
Hep B C IgM: NONREACTIVE
Hepatitis B Surface Ag: NONREACTIVE
Hepatitis C Ab: REACTIVE — AB

## 2023-01-22 LAB — HEPATITIS C GENOTYPE

## 2023-01-22 LAB — HEPATITIS B SURFACE ANTIBODY,QUALITATIVE: Hep B S Ab: NONREACTIVE

## 2023-01-22 LAB — HCV RNA,QUANTITATIVE REAL TIME PCR
HCV Quantitative Log: 6.57 Log IU/mL — ABNORMAL HIGH
HCV RNA, PCR, QN: 3700000 IU/mL — ABNORMAL HIGH

## 2023-01-22 LAB — HIV ANTIBODY (ROUTINE TESTING W REFLEX): HIV 1&2 Ab, 4th Generation: NONREACTIVE

## 2023-01-22 LAB — HEPATITIS C RNA QUANTITATIVE
HCV Quantitative Log: 6.68 log IU/mL — ABNORMAL HIGH
HCV RNA, PCR, QN: 4780000 IU/mL — ABNORMAL HIGH

## 2023-01-22 LAB — HEPATITIS A ANTIBODY, TOTAL: Hepatitis A AB,Total: REACTIVE — AB

## 2023-01-25 ENCOUNTER — Other Ambulatory Visit (INDEPENDENT_AMBULATORY_CARE_PROVIDER_SITE_OTHER): Payer: Self-pay | Admitting: *Deleted

## 2023-01-25 DIAGNOSIS — R768 Other specified abnormal immunological findings in serum: Secondary | ICD-10-CM

## 2023-02-07 ENCOUNTER — Ambulatory Visit (HOSPITAL_COMMUNITY)
Admission: RE | Admit: 2023-02-07 | Discharge: 2023-02-07 | Disposition: A | Discharge status: Home / Self Care | Payer: BC Managed Care – PPO | Source: Ambulatory Visit | Attending: Internal Medicine | Admitting: Internal Medicine

## 2023-02-07 DIAGNOSIS — R768 Other specified abnormal immunological findings in serum: Secondary | ICD-10-CM | POA: Insufficient documentation

## 2023-02-07 DIAGNOSIS — B182 Chronic viral hepatitis C: Secondary | ICD-10-CM | POA: Diagnosis not present

## 2023-02-19 DIAGNOSIS — F1120 Opioid dependence, uncomplicated: Secondary | ICD-10-CM | POA: Diagnosis not present

## 2023-02-27 ENCOUNTER — Other Ambulatory Visit: Payer: Self-pay | Admitting: *Deleted

## 2023-02-27 MED ORDER — MAVYRET 100-40 MG PO TABS: ORAL_TABLET | ORAL | 1 refills | Status: AC

## 2023-03-11 NOTE — Progress Notes (Signed)
Form from [facility] faxed in. Questions filled out and need Dr. Queen Blossom signature. Await signature.

## 2023-03-13 NOTE — Progress Notes (Signed)
Spoke with Helmut Muster pharmacist at [facility] and was told they should contact patient within 48 hours to set up delivery.

## 2023-03-18 ENCOUNTER — Other Ambulatory Visit (INDEPENDENT_AMBULATORY_CARE_PROVIDER_SITE_OTHER): Payer: Self-pay | Admitting: *Deleted

## 2023-03-18 NOTE — Progress Notes (Deleted)
GI Office Note    Referring Provider: No ref. provider found Primary Care Physician:  Patient, No Pcp Per  Primary Gastroenterologist: Hennie Duos. Marletta Lor, DO   Chief Complaint   No chief complaint on file.   History of Present Illness   Linda Dyer is a 32 y.o. female presenting today for follow-up.  Last seen in February 2024.  Diagnosed with hepatitis C 7 years ago.  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  Chronically takes Suboxone.  Also with history of GERD and occasional dysphagia.  Started on pantoprazole 40 mg daily.  If symptoms do not improve she may need upper endoscopy.  Constipation started on MiraLAX fiber supplement.   Right upper quadrant ultrasound with elastography March 2024 median K PA 4.7.  Coarse hepatic echotexture.  Labs from February 2024: HCVRNA 3,700,000, hepatitis B surface antigen negative, hepatitis B core IgM negative, hepatitis A IgM negative, hepatitis B surface antibody nonreactive, hepatitis A total antibody reactive, AST 59, ALT 58, total bilirubin 0.5, alkaline phosphatase 68, albumin 4.2, HIV nonreactive, HCV genotype Ia.  Patient was prescribed Mavyret for 8 weeks.  Plans for CBC, CMET, HCVRNA at 4 weeks of treatment.   Medications   Current Outpatient Medications  Medication Sig Dispense Refill   buprenorphine-naloxone (SUBOXONE) 8-2 mg SUBL SL tablet Place 1 tablet under the tongue daily.     citalopram (CELEXA) 20 MG tablet Take 20 mg by mouth daily.     Glecaprevir-Pibrentasvir (MAVYRET) 100-40 MG TABS Take three tablets orally once daily for 8 weeks. 84 tablet 1   hydrOXYzine (ATARAX/VISTARIL) 25 MG tablet Take 25 mg by mouth 3 (three) times daily as needed for anxiety.     pantoprazole (PROTONIX) 40 MG tablet Take 1 tablet (40 mg total) by mouth daily. 30 tablet 11   No current facility-administered medications for this visit.    Allergies   Allergies as of 03/19/2023   (No Known Allergies)     Past  Medical History   Past Medical History:  Diagnosis Date   Constipation    around age 54   Depression    History of stomach ulcers    Polysubstance abuse (HCC)     Past Surgical History   Past Surgical History:  Procedure Laterality Date   FRACTURE SURGERY     left arm      Past Family History   Family History  Problem Relation Age of Onset   Diabetes Father     Past Social History   Social History   Socioeconomic History   Marital status: Married    Spouse name: Not on file   Number of children: Not on file   Years of education: Not on file   Highest education level: Not on file  Occupational History   Not on file  Tobacco Use   Smoking status: Every Day    Packs/day: 1    Types: Cigarettes    Passive exposure: Current   Smokeless tobacco: Never  Vaping Use   Vaping Use: Never used  Substance and Sexual Activity   Alcohol use: No   Drug use: Not Currently    Types: Cocaine    Comment: painmeds, xanax   Sexual activity: Yes    Birth control/protection: None  Other Topics Concern   Not on file  Social History Narrative   Not on file   Social Determinants of Health   Financial Resource Strain: Not on file  Food Insecurity: Not on file  Transportation Needs: Not on file  Physical Activity: Not on file  Stress: Not on file  Social Connections: Not on file  Intimate Partner Violence: Not on file    Review of Systems   General: Negative for anorexia, weight loss, fever, chills, fatigue, weakness. ENT: Negative for hoarseness, difficulty swallowing , nasal congestion. CV: Negative for chest pain, angina, palpitations, dyspnea on exertion, peripheral edema.  Respiratory: Negative for dyspnea at rest, dyspnea on exertion, cough, sputum, wheezing.  GI: See history of present illness. GU:  Negative for dysuria, hematuria, urinary incontinence, urinary frequency, nocturnal urination.  Endo: Negative for unusual weight change.     Physical Exam    There were no vitals taken for this visit.   General: Well-nourished, well-developed in no acute distress.  Eyes: No icterus. Mouth: Oropharyngeal mucosa moist and pink , no lesions erythema or exudate. Lungs: Clear to auscultation bilaterally.  Heart: Regular rate and rhythm, no murmurs rubs or gallops.  Abdomen: Bowel sounds are normal, nontender, nondistended, no hepatosplenomegaly or masses,  no abdominal bruits or hernia , no rebound or guarding.  Rectal: ***  Extremities: No lower extremity edema. No clubbing or deformities. Neuro: Alert and oriented x 4   Skin: Warm and dry, no jaundice.   Psych: Alert and cooperative, normal mood and affect.  Labs   *** Imaging Studies   No results found.  Assessment       PLAN   ***   Leanna Battles. Melvyn Neth, MHS, PA-C [facility]

## 2023-03-18 NOTE — Progress Notes (Signed)
Called patient to get update and she still has not heard from pharmacy. I called [facility] and was told she needed to call [phone] to get co pay card then call pharmacy back at [phone]. Discussed all with patient and told her to follow up with me if any issues.

## 2023-03-19 ENCOUNTER — Ambulatory Visit: Payer: BC Managed Care – PPO | Admitting: Gastroenterology

## 2023-03-19 ENCOUNTER — Telehealth (INDEPENDENT_AMBULATORY_CARE_PROVIDER_SITE_OTHER): Payer: Self-pay | Admitting: *Deleted

## 2023-03-19 NOTE — Progress Notes (Signed)
Patient needed to call to get co pay assistance and I gave her number the pharmacy gave her [phone] to get med for $5 per month and then she needs to call pharmacy to give them co pay card information.pharmacy number given to pt [phone].  Patient verbalized understanding of all

## 2023-03-25 ENCOUNTER — Encounter (INDEPENDENT_AMBULATORY_CARE_PROVIDER_SITE_OTHER): Payer: Self-pay | Admitting: *Deleted

## 2023-03-29 NOTE — Telephone Encounter (Signed)
error 

## 2023-04-10 ENCOUNTER — Encounter (INDEPENDENT_AMBULATORY_CARE_PROVIDER_SITE_OTHER): Payer: Self-pay | Admitting: *Deleted

## 2023-04-10 ENCOUNTER — Telehealth (INDEPENDENT_AMBULATORY_CARE_PROVIDER_SITE_OTHER): Payer: Self-pay | Admitting: *Deleted

## 2023-04-10 ENCOUNTER — Other Ambulatory Visit (INDEPENDENT_AMBULATORY_CARE_PROVIDER_SITE_OTHER): Payer: Self-pay | Admitting: *Deleted

## 2023-04-10 DIAGNOSIS — R768 Other specified abnormal immunological findings in serum: Secondary | ICD-10-CM

## 2023-04-10 NOTE — Telephone Encounter (Signed)
Patient needs office visit after completing treatment for hep C. Will complete May 17, 2023. Please schedule a follow up visit after June 28th.

## 2023-04-10 NOTE — Telephone Encounter (Signed)
Patient in reminder file to do labs 4 weeks after starting mavyret. Cbc, cmp, hcv rna per dr Marletta Lor. Pt was called and notified to do labs may 31st. She wanted to use [facility] and wanted orders mailed to her. Orders put in and mailed.

## 2023-04-16 DIAGNOSIS — F1120 Opioid dependence, uncomplicated: Secondary | ICD-10-CM | POA: Diagnosis not present

## 2023-05-17 DIAGNOSIS — R768 Other specified abnormal immunological findings in serum: Secondary | ICD-10-CM | POA: Diagnosis not present

## 2023-05-18 LAB — HCV RNA QUANT

## 2023-05-19 LAB — CBC
Hematocrit: 41.8 % (ref 34.0–46.6)
Hemoglobin: 14.3 g/dL (ref 11.1–15.9)
MCH: 31.4 pg (ref 26.6–33.0)
MCHC: 34.2 g/dL (ref 31.5–35.7)
MCV: 92 fL (ref 79–97)
Platelets: 176 10*3/uL (ref 150–450)
RBC: 4.56 x10E6/uL (ref 3.77–5.28)
RDW: 11.8 % (ref 11.7–15.4)
WBC: 7.3 10*3/uL (ref 3.4–10.8)

## 2023-05-19 LAB — HCV RNA QUANT: Hepatitis C Quantitation: NOT DETECTED IU/mL

## 2023-05-19 LAB — COMPREHENSIVE METABOLIC PANEL
ALT: 16 IU/L (ref 0–32)
AST: 19 IU/L (ref 0–40)
Albumin: 4.2 g/dL (ref 3.9–4.9)
Alkaline Phosphatase: 70 IU/L (ref 44–121)
BUN/Creatinine Ratio: 16 (ref 9–23)
BUN: 11 mg/dL (ref 6–20)
Bilirubin Total: 0.2 mg/dL (ref 0.0–1.2)
CO2: 25 mmol/L (ref 20–29)
Calcium: 9.2 mg/dL (ref 8.7–10.2)
Chloride: 100 mmol/L (ref 96–106)
Creatinine, Ser: 0.68 mg/dL (ref 0.57–1.00)
Globulin, Total: 3 g/dL (ref 1.5–4.5)
Glucose: 76 mg/dL (ref 70–99)
Potassium: 4.6 mmol/L (ref 3.5–5.2)
Sodium: 138 mmol/L (ref 134–144)
Total Protein: 7.2 g/dL (ref 6.0–8.5)
eGFR: 118 mL/min/{1.73_m2} (ref >59)

## 2023-05-20 ENCOUNTER — Encounter: Payer: Self-pay | Admitting: Gastroenterology

## 2023-05-20 ENCOUNTER — Ambulatory Visit (INDEPENDENT_AMBULATORY_CARE_PROVIDER_SITE_OTHER): Payer: BC Managed Care – PPO | Admitting: Gastroenterology

## 2023-05-20 VITALS — BP 124/81 | HR 77 | Temp 97.7°F | Ht 71.0 in | Wt 235.4 lb

## 2023-05-20 DIAGNOSIS — B182 Chronic viral hepatitis C: Secondary | ICD-10-CM

## 2023-05-20 DIAGNOSIS — K5900 Constipation, unspecified: Secondary | ICD-10-CM

## 2023-05-20 DIAGNOSIS — K219 Gastro-esophageal reflux disease without esophagitis: Secondary | ICD-10-CM

## 2023-05-20 NOTE — Progress Notes (Signed)
GI Office Note    Referring Provider: No ref. provider found Primary Care Physician:  Patient, No Pcp Per Primary Gastroenterologist: Hennie Duos. Marletta Lor, DO  Date:  05/20/2023  ID:  Linda Dyer, DOB [birth year], MRN [id number]   Chief Complaint   Chief Complaint  Patient presents with   Follow-up    Follow up on Hep C   History of Present Illness  Linda Dyer is a 33 y.o. female with a history of hepatitis C secondary to IV drug use and constipation presenting today for follow up of Hepatitis C.   Last office visit 01/16/23.  Noted to have chronic constipation which was worse since being on Suboxone.  Was taking Dulcolax as needed which was helping some but not having a bowel movement when she took it.  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  She also had reported intermittent acid reflux and dysphagia and was started on pantoprazole 40 mg once daily.  Labs including hepatitis B, hepatitis A, HIV, hep C RNA, and genotype checked with elastography ordered.  Genotype 1a, viral load 4.7 million.   [place] elastography 02/09/23: -Coarse hepatic echotexture.  -CBD 6 mm -kPa 4.7  Started Mavyret 03/22/23. Last day of treatment 05/17/23.   Labs 05/17/23: Normal LFTs and CBC. HCV not detected.   Today: Has to take dulcolax as needed for her constipation and takes it at night. Also using miralax as needed, maybe once per month. Not straining. No issues with her suboxone.  Took her last dose of Mavyret on Thursday 6/27 and went for lab draw on 6/28.  No unintentional weight loss, lack of appetite, early satiety, acid reflux, nausea, vomiting, dysphagia, diarrhea, abdominal pain, confusion, pruritus, melena, BRBPR.   Wt Readings from Last 3 Encounters:  05/20/23 235 lb 6.4 oz (106.8 kg)  01/16/23 253 lb 14.4 oz (115.2 kg)  12/30/19 250 lb (113.4 kg)     Current Outpatient Medications  Medication Sig Dispense Refill   buprenorphine-naloxone  (SUBOXONE) 8-2 mg SUBL SL tablet Place 1 tablet under the tongue daily.     citalopram (CELEXA) 20 MG tablet Take 20 mg by mouth daily.     Glecaprevir-Pibrentasvir (MAVYRET) 100-40 MG TABS Take three tablets orally once daily for 8 weeks. 84 tablet 1   hydrOXYzine (ATARAX/VISTARIL) 25 MG tablet Take 25 mg by mouth 3 (three) times daily as needed for anxiety.     pantoprazole (PROTONIX) 40 MG tablet Take 1 tablet (40 mg total) by mouth daily. 30 tablet 11   No current facility-administered medications for this visit.    Past Medical History:  Diagnosis Date   Constipation    around age 8   Depression    History of stomach ulcers    Polysubstance abuse (HCC)     Past Surgical History:  Procedure Laterality Date   FRACTURE SURGERY     left arm      Family History  Problem Relation Age of Onset   Diabetes Father     Allergies as of 05/20/2023   (No Known Allergies)    Social History   Socioeconomic History   Marital status: Married    Spouse name: Not on file   Number of children: Not on file   Years of education: Not on file   Highest education level: Not on file  Occupational History   Not on file  Tobacco Use   Smoking status: Every Day    Packs/day:  1    Types: Cigarettes    Passive exposure: Current   Smokeless tobacco: Never  Vaping Use   Vaping Use: Never used  Substance and Sexual Activity   Alcohol use: No   Drug use: Not Currently    Types: Cocaine    Comment: painmeds, xanax   Sexual activity: Yes    Birth control/protection: None  Other Topics Concern   Not on file  Social History Narrative   Not on file   Social Determinants of Health   Financial Resource Strain: Not on file  Food Insecurity: Not on file  Transportation Needs: Not on file  Physical Activity: Not on file  Stress: Not on file  Social Connections: Not on file     Review of Systems   Gen: Denies fever, chills, anorexia. Denies fatigue, weakness, weight loss.  CV:  Denies chest pain, palpitations, syncope, peripheral edema, and claudication. Resp: Denies dyspnea at rest, cough, wheezing, coughing up blood, and pleurisy. GI: See HPI Derm: Denies rash, itching, dry skin Psych: Denies depression, anxiety, memory loss, confusion. No homicidal or suicidal ideation.  Heme: Denies bruising, bleeding, and enlarged lymph nodes.   Physical Exam   BP 124/81   Pulse 77   Temp 97.7 F (36.5 C)   Ht 5\' 11"  (1.803 m)   Wt 235 lb 6.4 oz (106.8 kg)   LMP 04/22/2023   BMI 32.83 kg/m   General:   Alert and oriented. No distress noted. Pleasant and cooperative.  Head:  Normocephalic and atraumatic. Eyes:  Conjuctiva clear without scleral icterus. Mouth:  Oral mucosa pink and moist. Good dentition. No lesions. Lungs:  Clear to auscultation bilaterally. No wheezes, rales, or rhonchi. No distress.  Heart:  S1, S2 present without murmurs appreciated.  Abdomen:  +BS, soft, non-tender and non-distended. No rebound or guarding. No HSM or masses noted. Rectal: deferred Msk:  Symmetrical without gross deformities. Normal posture. Extremities:  Without edema. Neurologic:  Alert and  oriented x4 Psych:  Alert and cooperative. Normal mood and affect.  Assessment  Linda Dyer is a 32 y.o. female with a history of hepatitis C secondary to IV drug use and constipation presenting today for follow up of Hepatitis C.   Hepatitis C: Secondary to IV drug use.  She was genotype Ia and was treated with 8 weeks of Mavyret which she just recently completed with labs indicating SVR however will need to follow up labs in 12 weeks to ensure complete eradication.  She has been drug-free for 4 years and denied any complications with Mavyret.  GERD: Well-controlled with pantoprazole 40 mg once daily.  Denies any nausea, vomiting, or dysphagia.  Constipation: Well-controlled with Dulcolax and MiraLAX as needed.  Has been working on increasing her water intake as well as her fiber  intake with a lots of green leafy vegetables and fruits as well as incorporating exercise by going to the gym and to her regimen and she has lost 18 pounds since her last visit.   PLAN   Recall for HFP and HCV RNA in 12 weeks. Continue high-fiber diet and adequate hydration Continue Dulcolax and MiraLAX as needed. Continue pantoprazole 40 mg once daily Follow up in 1 year.    Brooke Bonito, MSN, FNP-BC, AGACNP-BC [facility]

## 2023-05-20 NOTE — Patient Instructions (Addendum)
We will obtain a repeat HCV level and liver enzymes in 12 weeks.  Continue Dulcolax and MiraLAX as needed.  Continue with your high-fiber diet increasing her water intake and exercise!  Congratulations on your sobriety and your recent weight loss!  We will plan to follow-up in 1 year, sooner if needed.  It was a pleasure to see you today. I want to create trusting relationships with patients. If you receive a survey regarding your visit,  I greatly appreciate you taking time to fill this out on paper or through your MyChart. I value your feedback.  Brooke Bonito, MSN, FNP-BC, AGACNP-BC [facility]

## 2023-05-27 ENCOUNTER — Ambulatory Visit (INDEPENDENT_AMBULATORY_CARE_PROVIDER_SITE_OTHER): Payer: BC Managed Care – PPO | Admitting: Gastroenterology

## 2023-06-04 DIAGNOSIS — F1120 Opioid dependence, uncomplicated: Secondary | ICD-10-CM | POA: Diagnosis not present

## 2023-06-26 DIAGNOSIS — U071 COVID-19: Secondary | ICD-10-CM | POA: Diagnosis not present

## 2023-06-26 DIAGNOSIS — Z20822 Contact with and (suspected) exposure to covid-19: Secondary | ICD-10-CM | POA: Diagnosis not present

## 2023-07-03 DIAGNOSIS — F1120 Opioid dependence, uncomplicated: Secondary | ICD-10-CM | POA: Diagnosis not present

## 2023-07-31 DIAGNOSIS — F1120 Opioid dependence, uncomplicated: Secondary | ICD-10-CM | POA: Diagnosis not present

## 2023-08-01 ENCOUNTER — Other Ambulatory Visit: Payer: Self-pay

## 2023-08-01 DIAGNOSIS — B182 Chronic viral hepatitis C: Secondary | ICD-10-CM

## 2023-08-01 DIAGNOSIS — R768 Other specified abnormal immunological findings in serum: Secondary | ICD-10-CM

## 2023-08-06 DIAGNOSIS — F1120 Opioid dependence, uncomplicated: Secondary | ICD-10-CM | POA: Diagnosis not present

## 2023-08-13 DIAGNOSIS — B182 Chronic viral hepatitis C: Secondary | ICD-10-CM | POA: Diagnosis not present

## 2023-08-13 DIAGNOSIS — R768 Other specified abnormal immunological findings in serum: Secondary | ICD-10-CM | POA: Diagnosis not present

## 2023-08-14 LAB — HCV RNA QUANT

## 2023-08-15 LAB — HEPATIC FUNCTION PANEL
ALT: 8 IU/L (ref 0–32)
AST: 15 IU/L (ref 0–40)
Albumin: 4.2 g/dL (ref 3.9–4.9)
Alkaline Phosphatase: 60 IU/L (ref 44–121)
Bilirubin Total: <0.2 mg/dL (ref 0.0–1.2)
Bilirubin, Direct: <0.1 mg/dL (ref 0.00–0.40)
Total Protein: 6.8 g/dL (ref 6.0–8.5)

## 2023-08-15 LAB — HCV RNA QUANT

## 2023-08-29 DIAGNOSIS — F331 Major depressive disorder, recurrent, moderate: Secondary | ICD-10-CM | POA: Diagnosis not present

## 2023-09-25 DIAGNOSIS — F1120 Opioid dependence, uncomplicated: Secondary | ICD-10-CM | POA: Diagnosis not present

## 2023-10-23 DIAGNOSIS — F1120 Opioid dependence, uncomplicated: Secondary | ICD-10-CM | POA: Diagnosis not present

## 2023-11-27 DIAGNOSIS — F1120 Opioid dependence, uncomplicated: Secondary | ICD-10-CM | POA: Diagnosis not present

## 2023-12-24 DIAGNOSIS — R070 Pain in throat: Secondary | ICD-10-CM | POA: Diagnosis not present

## 2023-12-24 DIAGNOSIS — R6889 Other general symptoms and signs: Secondary | ICD-10-CM | POA: Diagnosis not present

## 2023-12-24 DIAGNOSIS — Z20822 Contact with and (suspected) exposure to covid-19: Secondary | ICD-10-CM | POA: Diagnosis not present

## 2023-12-25 DIAGNOSIS — F1120 Opioid dependence, uncomplicated: Secondary | ICD-10-CM | POA: Diagnosis not present

## 2024-01-01 DIAGNOSIS — F1120 Opioid dependence, uncomplicated: Secondary | ICD-10-CM | POA: Diagnosis not present

## 2024-02-05 DIAGNOSIS — F1120 Opioid dependence, uncomplicated: Secondary | ICD-10-CM | POA: Diagnosis not present

## 2024-03-11 DIAGNOSIS — F1120 Opioid dependence, uncomplicated: Secondary | ICD-10-CM | POA: Diagnosis not present

## 2024-04-01 DIAGNOSIS — F1120 Opioid dependence, uncomplicated: Secondary | ICD-10-CM | POA: Diagnosis not present

## 2024-04-21 ENCOUNTER — Encounter: Payer: Self-pay | Admitting: Gastroenterology

## 2024-04-28 DIAGNOSIS — N760 Acute vaginitis: Secondary | ICD-10-CM | POA: Diagnosis not present

## 2024-04-28 DIAGNOSIS — F17210 Nicotine dependence, cigarettes, uncomplicated: Secondary | ICD-10-CM | POA: Diagnosis not present

## 2024-04-28 DIAGNOSIS — B3731 Acute candidiasis of vulva and vagina: Secondary | ICD-10-CM | POA: Diagnosis not present

## 2024-04-28 DIAGNOSIS — B9689 Other specified bacterial agents as the cause of diseases classified elsewhere: Secondary | ICD-10-CM | POA: Diagnosis not present

## 2024-04-29 DIAGNOSIS — F1120 Opioid dependence, uncomplicated: Secondary | ICD-10-CM | POA: Diagnosis not present

## 2024-06-03 DIAGNOSIS — F1120 Opioid dependence, uncomplicated: Secondary | ICD-10-CM | POA: Diagnosis not present

## 2024-07-01 DIAGNOSIS — F1120 Opioid dependence, uncomplicated: Secondary | ICD-10-CM | POA: Diagnosis not present

## 2024-07-27 DIAGNOSIS — F1120 Opioid dependence, uncomplicated: Secondary | ICD-10-CM | POA: Diagnosis not present

## 2024-08-24 DIAGNOSIS — F1120 Opioid dependence, uncomplicated: Secondary | ICD-10-CM | POA: Diagnosis not present

## 2024-09-04 ENCOUNTER — Other Ambulatory Visit: Payer: Self-pay | Admitting: Medical Genetics

## 2024-09-27 DIAGNOSIS — H6501 Acute serous otitis media, right ear: Secondary | ICD-10-CM | POA: Diagnosis not present

## 2024-10-03 DIAGNOSIS — H6501 Acute serous otitis media, right ear: Secondary | ICD-10-CM | POA: Diagnosis not present

## 2024-10-05 DIAGNOSIS — F1120 Opioid dependence, uncomplicated: Secondary | ICD-10-CM | POA: Diagnosis not present

## 2024-10-16 DIAGNOSIS — L03116 Cellulitis of left lower limb: Secondary | ICD-10-CM | POA: Diagnosis not present

## 2024-10-16 DIAGNOSIS — F17210 Nicotine dependence, cigarettes, uncomplicated: Secondary | ICD-10-CM | POA: Diagnosis not present

## 2024-10-16 DIAGNOSIS — L0390 Cellulitis, unspecified: Secondary | ICD-10-CM | POA: Diagnosis not present

## 2024-10-16 DIAGNOSIS — M79605 Pain in left leg: Secondary | ICD-10-CM | POA: Diagnosis not present

## 2024-10-16 DIAGNOSIS — M7989 Other specified soft tissue disorders: Secondary | ICD-10-CM | POA: Diagnosis not present

## 2024-10-21 DIAGNOSIS — M84362A Stress fracture, left tibia, initial encounter for fracture: Secondary | ICD-10-CM | POA: Diagnosis not present

## 2024-10-21 DIAGNOSIS — M25572 Pain in left ankle and joints of left foot: Secondary | ICD-10-CM | POA: Diagnosis not present

## 2024-10-21 DIAGNOSIS — M76812 Anterior tibial syndrome, left leg: Secondary | ICD-10-CM | POA: Diagnosis not present
